# Patient Record
Sex: Female | Born: 1985 | Race: Asian | Hispanic: No | Marital: Married | Smoking: Never smoker
Health system: Southern US, Community
[De-identification: ages and names within clinical notes are randomized; demographics above are authoritative.]

## PROBLEM LIST (undated history)

## (undated) DIAGNOSIS — G5601 Carpal tunnel syndrome, right upper limb: Secondary | ICD-10-CM

## (undated) HISTORY — PX: THYROID CYST EXCISION: SHX2511

---

## 2006-08-27 ENCOUNTER — Inpatient Hospital Stay (HOSPITAL_COMMUNITY): Admission: AD | Admit: 2006-08-27 | Discharge: 2006-08-28 | Payer: Self-pay | Admitting: Obstetrics & Gynecology

## 2006-11-08 ENCOUNTER — Ambulatory Visit (HOSPITAL_COMMUNITY): Admission: RE | Admit: 2006-11-08 | Discharge: 2006-11-08 | Payer: Self-pay | Admitting: Family Medicine

## 2007-03-13 ENCOUNTER — Inpatient Hospital Stay (HOSPITAL_COMMUNITY): Admission: AD | Admit: 2007-03-13 | Discharge: 2007-03-13 | Payer: Self-pay | Admitting: Gynecology

## 2007-03-31 ENCOUNTER — Inpatient Hospital Stay (HOSPITAL_COMMUNITY): Admission: AD | Admit: 2007-03-31 | Discharge: 2007-03-31 | Payer: Self-pay | Admitting: Obstetrics and Gynecology

## 2007-04-01 ENCOUNTER — Ambulatory Visit: Payer: Self-pay | Admitting: Obstetrics and Gynecology

## 2007-04-01 ENCOUNTER — Inpatient Hospital Stay (HOSPITAL_COMMUNITY): Admission: AD | Admit: 2007-04-01 | Discharge: 2007-04-03 | Payer: Self-pay | Admitting: Obstetrics & Gynecology

## 2009-01-01 ENCOUNTER — Inpatient Hospital Stay (HOSPITAL_COMMUNITY): Admission: AD | Admit: 2009-01-01 | Discharge: 2009-01-02 | Payer: Self-pay | Admitting: Obstetrics and Gynecology

## 2009-01-09 ENCOUNTER — Inpatient Hospital Stay (HOSPITAL_COMMUNITY): Admission: AD | Admit: 2009-01-09 | Discharge: 2009-01-09 | Payer: Self-pay | Admitting: Obstetrics & Gynecology

## 2009-02-21 IMAGING — CT CT ANGIO CHEST
3 of 4 series · 19 of 30 positions shown · IV contrast (omnipaque)
Comparison: None.

CLINICAL DATA: 21 year old; 37 weeks pregnant with shortness of breath.
 CT ANGIOGRAPHY OF CHEST WITH CONTRAST ? 03/13/07:
TECHNIQUE: Multidetector CT imaging of the chest was performed during bolus injection of intravenous contrast.  Multiplanar CT angiographic image reconstructions were generated to evaluate the vascular anatomy. 
 Contrast: 150 cc Omnipaque 300 IV.

[Series 5: recon 3: pe chest · axial · 0.67mm/px · z∈[-244,-50]mm · 11 of 238 slices shown]
[im 22/238  lung]
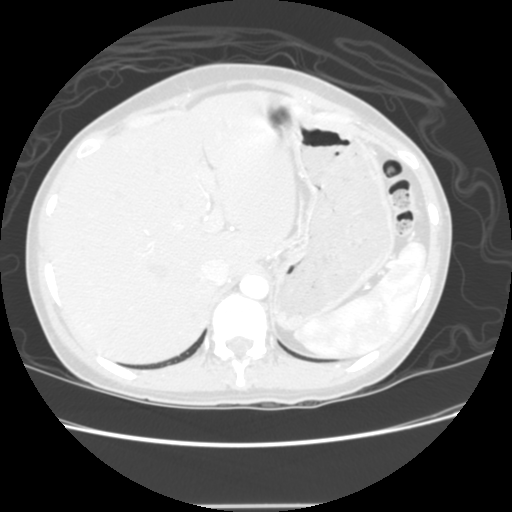
[im 44/238  mediastinal]
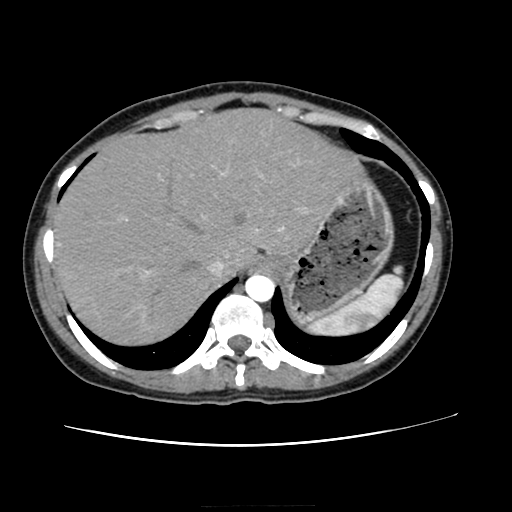
[im 65/238  lung]
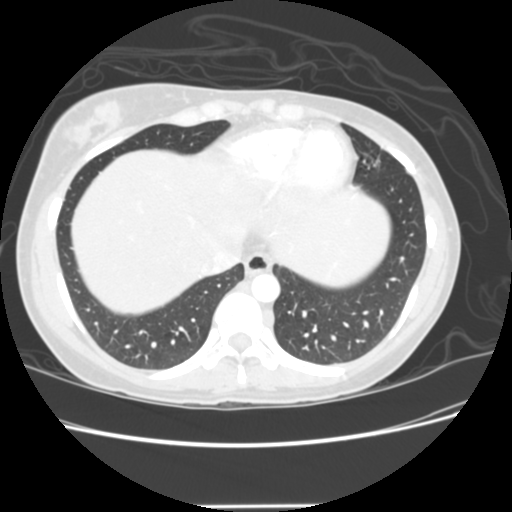
[im 87/238  mediastinal]
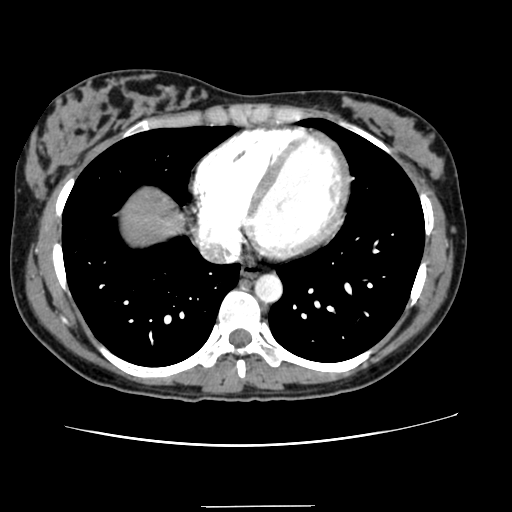
[im 108/238  lung]
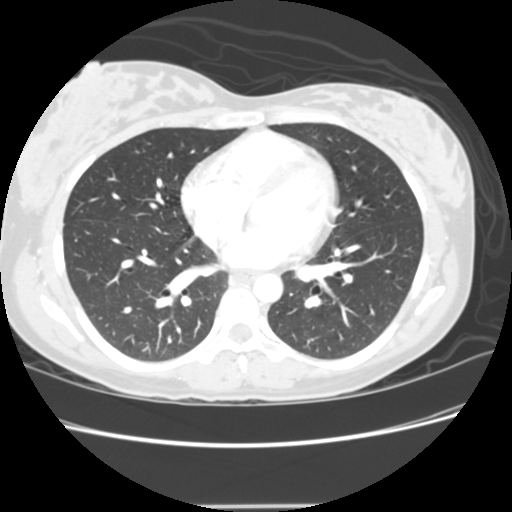
[im 130/238  mediastinal]
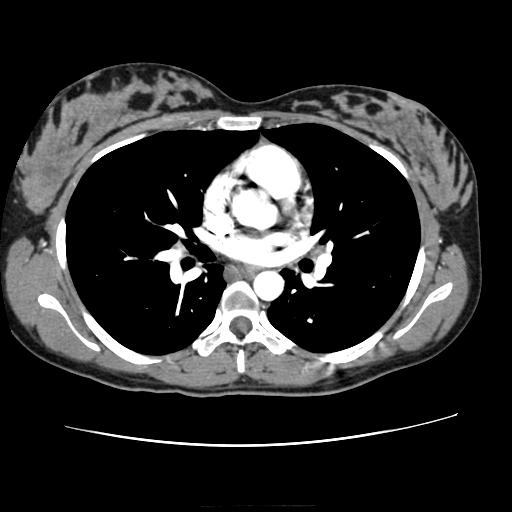
[im 151/238  lung]
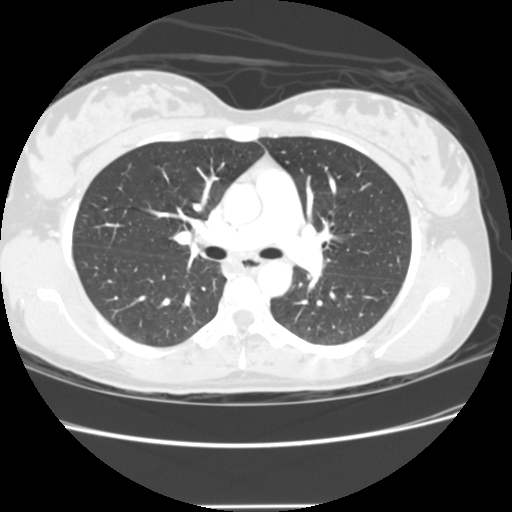
[im 152/238  mediastinal]
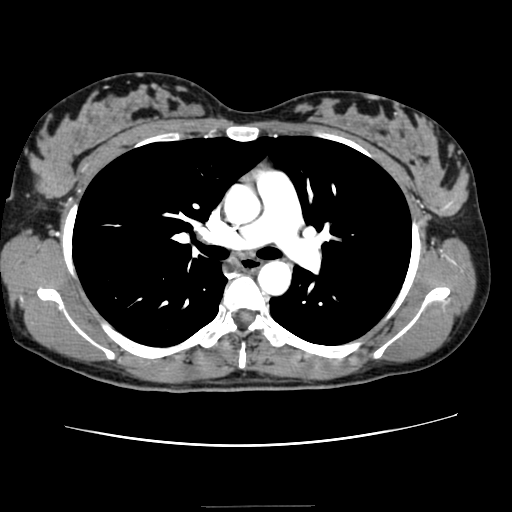
[im 173/238  lung]
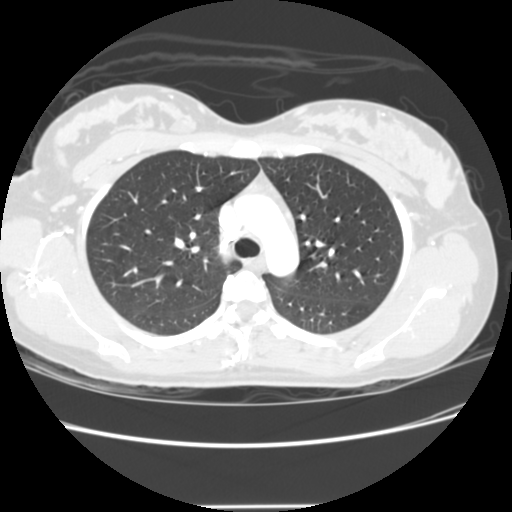
[im 194/238  mediastinal]
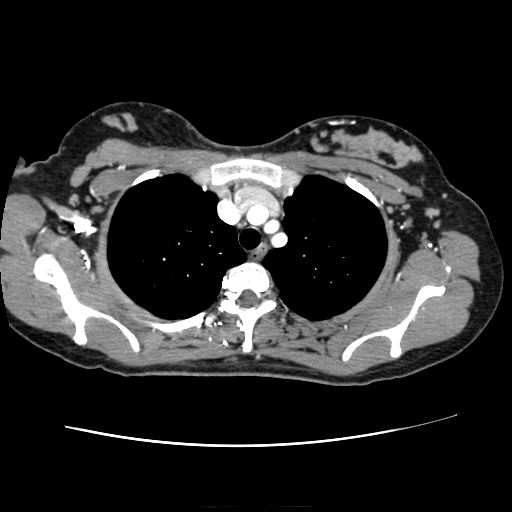
[im 216/238  lung]
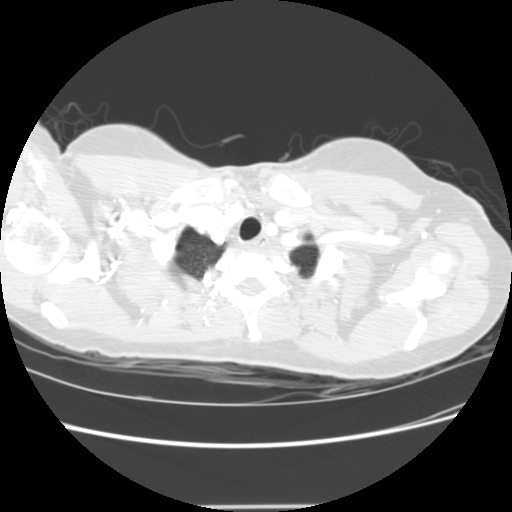

[Series 500: reformatted · coronal · 0.67mm/px · 3 of 106 slices shown (1 of 2)]
[im 27/106  lung]
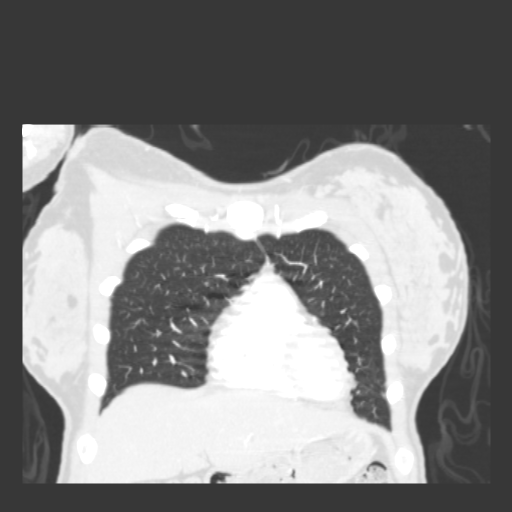
[im 53/106  lung]
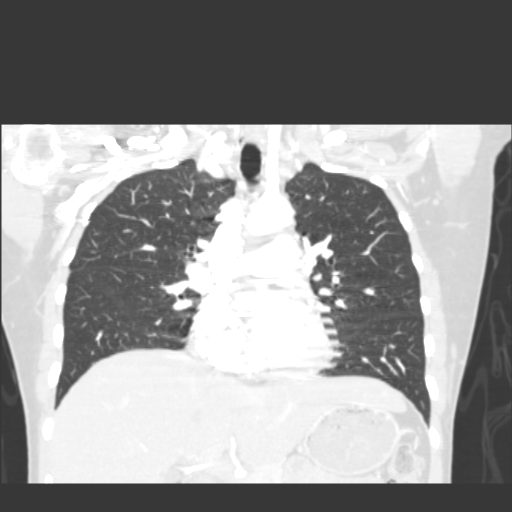
[im 79/106  lung]
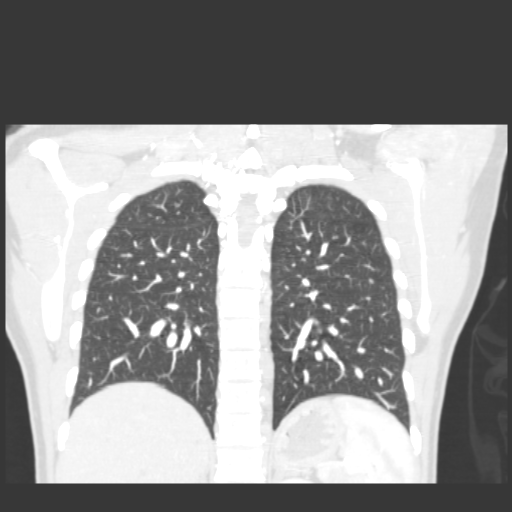

[Series 501: reformatted · sagittal · 0.67mm/px · 5 of 139 slices shown (2 of 2)]
[im 24/139  lung]
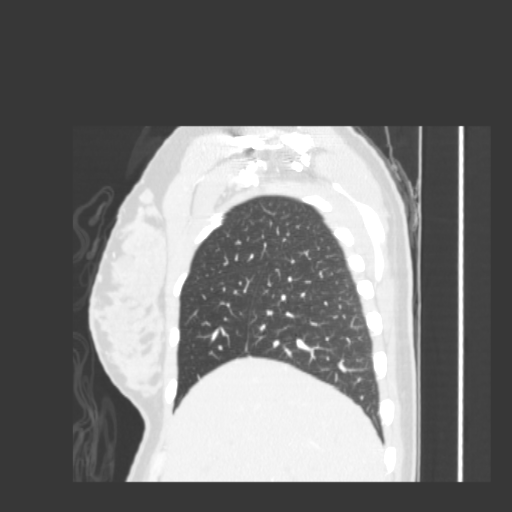
[im 47/139  lung]
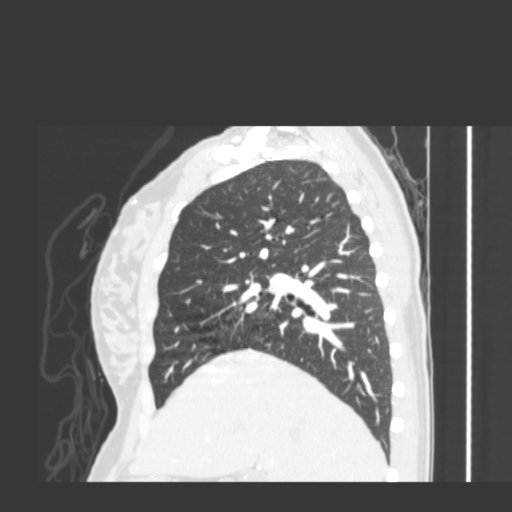
[im 70/139  lung]
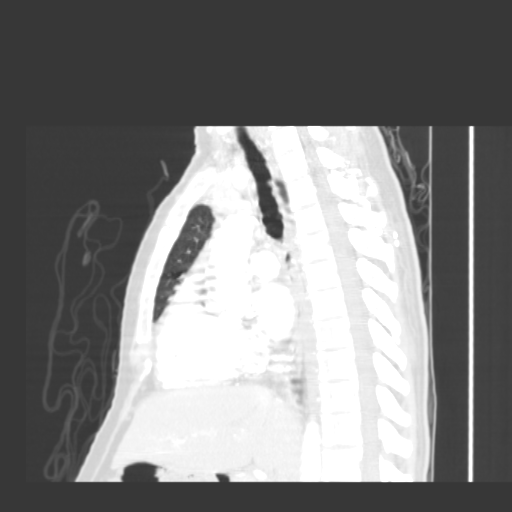
[im 93/139  lung]
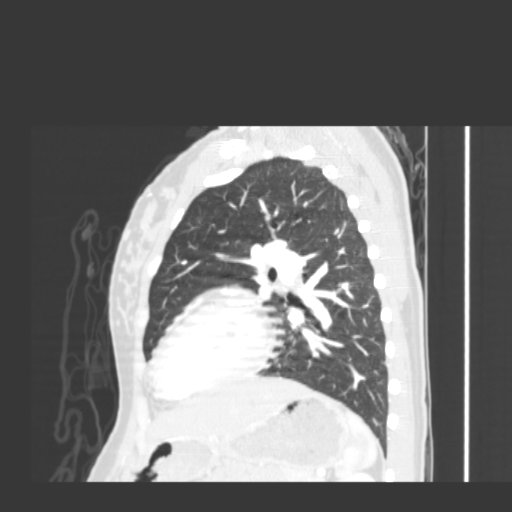
[im 116/139  lung]
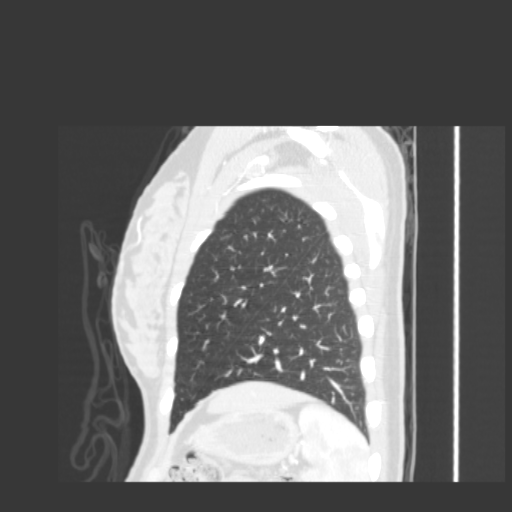

[19 of 30 positions shown; findings below may reference images not displayed]

FINDINGS: The chest wall, soft tissues, and bony structures are unremarkable.  No supraclavicular or axillary adenopathy.  
 The heart size is normal.   No pericardial effusion.   No mediastinal or hilar adenopathy.  The esophagus is grossly normal.  
 The aorta is normal in caliber.  No dissection.  Major branch vessels are normal.
 The pulmonary arterial tree is well opacified.  There are no filling defects to suggest pulmonary emboli.
 Examination of the lung parenchyma demonstrates no acute pulmonary findings.  No edema, infiltrates, or effusions. The tracheobronchial tree appears normal. 
 The visualized upper abdomen is unremarkable.
IMPRESSION: 1.  No CT evidence for pulmonary emboli.
 2.  No mediastinal or hilar adenopathy.
 3.  Normal thoracic aorta.
 4.  No acute pulmonary findings.

## 2009-04-29 ENCOUNTER — Ambulatory Visit (HOSPITAL_COMMUNITY): Admission: RE | Admit: 2009-04-29 | Discharge: 2009-04-29 | Payer: Self-pay | Admitting: Obstetrics & Gynecology

## 2009-08-16 ENCOUNTER — Inpatient Hospital Stay (HOSPITAL_COMMUNITY): Admission: AD | Admit: 2009-08-16 | Discharge: 2009-08-17 | Payer: Self-pay | Admitting: Family Medicine

## 2009-08-16 ENCOUNTER — Ambulatory Visit: Payer: Self-pay | Admitting: Family Medicine

## 2009-08-18 ENCOUNTER — Ambulatory Visit: Payer: Self-pay | Admitting: Obstetrics and Gynecology

## 2009-08-18 ENCOUNTER — Inpatient Hospital Stay (HOSPITAL_COMMUNITY): Admission: AD | Admit: 2009-08-18 | Discharge: 2009-08-19 | Payer: Self-pay | Admitting: Obstetrics and Gynecology

## 2010-07-15 LAB — CBC
HCT: 33.5 % — ABNORMAL LOW (ref 36.0–46.0)
Hemoglobin: 11.1 g/dL — ABNORMAL LOW (ref 12.0–15.0)
MCHC: 31.8 g/dL (ref 30.0–36.0)
MCV: 74.6 fL — ABNORMAL LOW (ref 78.0–100.0)
MCV: 75.8 fL — ABNORMAL LOW (ref 78.0–100.0)
Platelets: 179 10*3/uL (ref 150–400)
Platelets: 205 10*3/uL (ref 150–400)
RBC: 4.69 MIL/uL (ref 3.87–5.11)
RDW: 15.5 % (ref 11.5–15.5)

## 2010-07-15 LAB — RPR: RPR Ser Ql: NONREACTIVE

## 2010-08-01 LAB — CBC
HCT: 32.7 % — ABNORMAL LOW (ref 36.0–46.0)
Hemoglobin: 10.6 g/dL — ABNORMAL LOW (ref 12.0–15.0)
Hemoglobin: 9.9 g/dL — ABNORMAL LOW (ref 12.0–15.0)
MCHC: 32.3 g/dL (ref 30.0–36.0)
RBC: 4.43 MIL/uL (ref 3.87–5.11)
RDW: 16.4 % — ABNORMAL HIGH (ref 11.5–15.5)
WBC: 7.1 10*3/uL (ref 4.0–10.5)
WBC: 7.9 10*3/uL (ref 4.0–10.5)

## 2010-08-01 LAB — GC/CHLAMYDIA PROBE AMP, GENITAL: Chlamydia, DNA Probe: NEGATIVE

## 2010-08-01 LAB — POCT PREGNANCY, URINE: Preg Test, Ur: POSITIVE

## 2010-08-01 LAB — URINE MICROSCOPIC-ADD ON

## 2010-08-01 LAB — URINALYSIS, ROUTINE W REFLEX MICROSCOPIC
Bilirubin Urine: NEGATIVE
Ketones, ur: NEGATIVE mg/dL
Leukocytes, UA: NEGATIVE
Nitrite: NEGATIVE
Specific Gravity, Urine: <1.005 — ABNORMAL LOW (ref 1.005–1.030)
Urobilinogen, UA: 0.2 mg/dL (ref 0.0–1.0)

## 2010-08-01 LAB — ABO/RH: ABO/RH(D): A POS

## 2010-08-01 LAB — WET PREP, GENITAL
Trich, Wet Prep: NONE SEEN
Yeast Wet Prep HPF POC: NONE SEEN

## 2010-12-13 IMAGING — US US OB COMP LESS 14 WK
1 series · 14 of 28 positions shown · non-contrast
Comparison: none

CLINICAL DATA: Pain

OBSTETRIC <14 WK ULTRASOUND, TRANSVAGINAL OB US
TECHNIQUE: Transabdominal and transvaginal ultrasound was
performed for evaluation of the gestation as well as the maternal
uterus and adnexal regions.
Number of gestation: 1
Heart Rate: 106 bpm
CRL:  2.4 mm         5w  5d                US EDC:
Maternal uterus/adnexae:
There is no subchorionic hemorrhage.
The left ovary appears normal.
There is a simple appearing cyst within the right ovary measuring
2.7 x 2.4 x 2.0 cm.

[Series 1: us ob comp less 14 wks · 0.18mm/px · 14 of 40 slices shown]
[im 2/40]
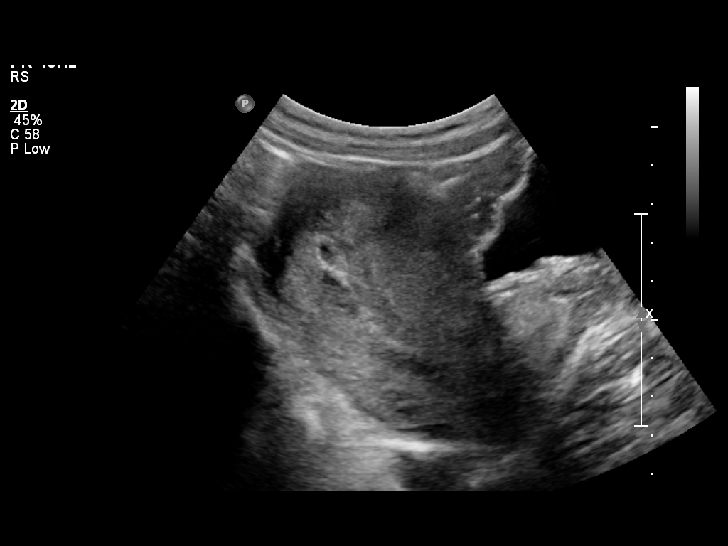
[im 5/40]
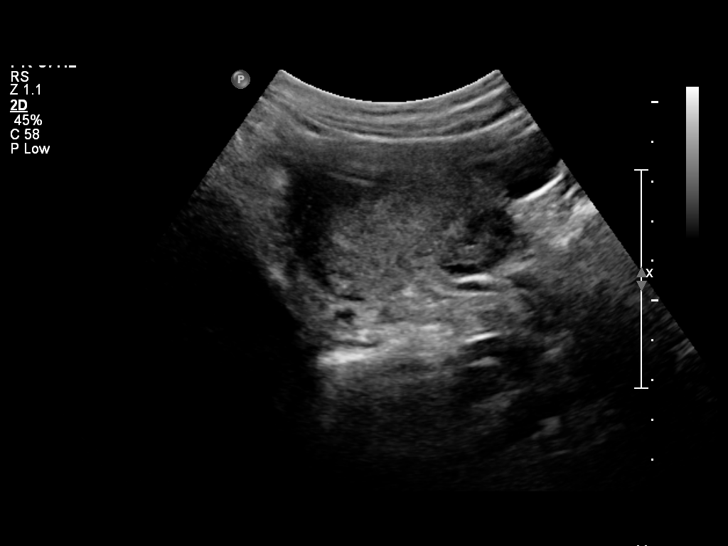
[im 8/40]
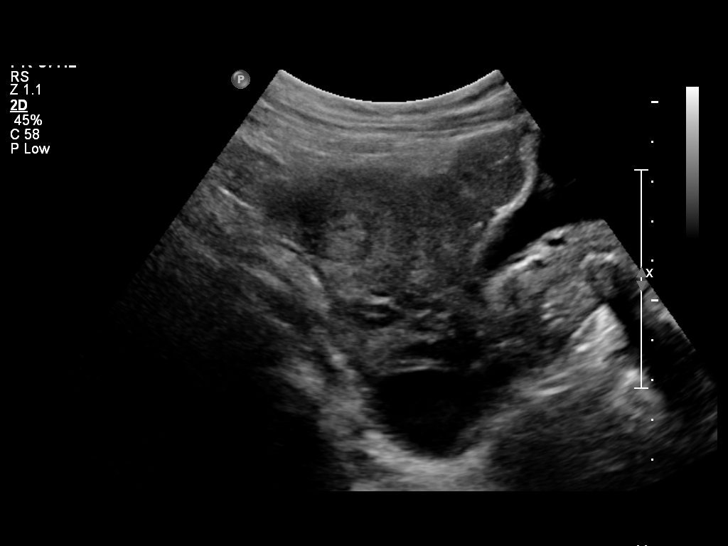
[im 11/40]
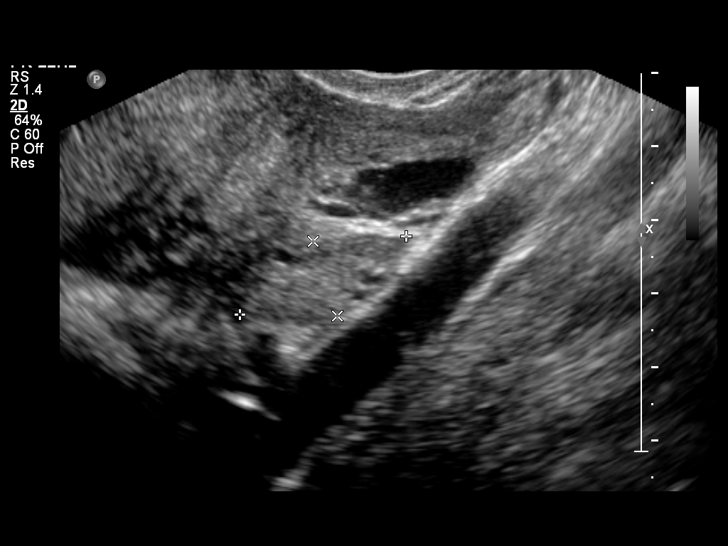
[im 14/40]
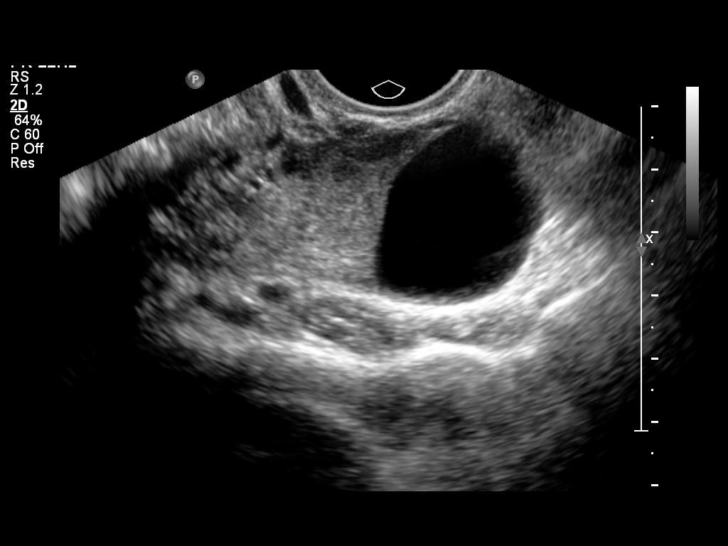
[im 16/40]
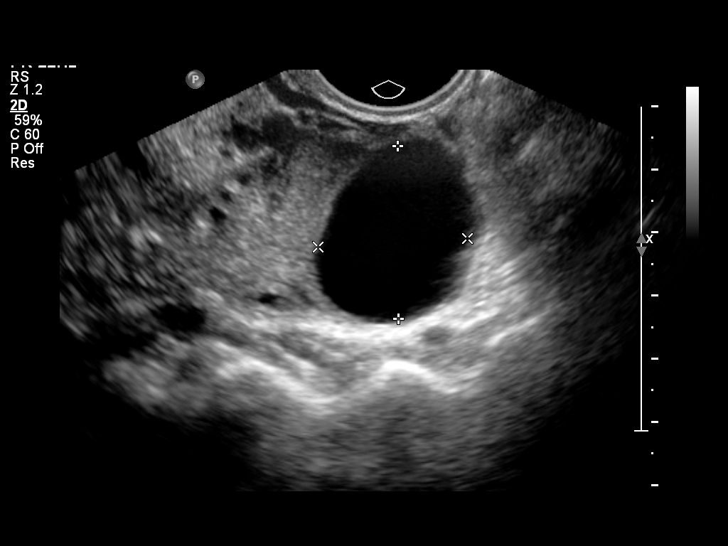
[im 19/40]
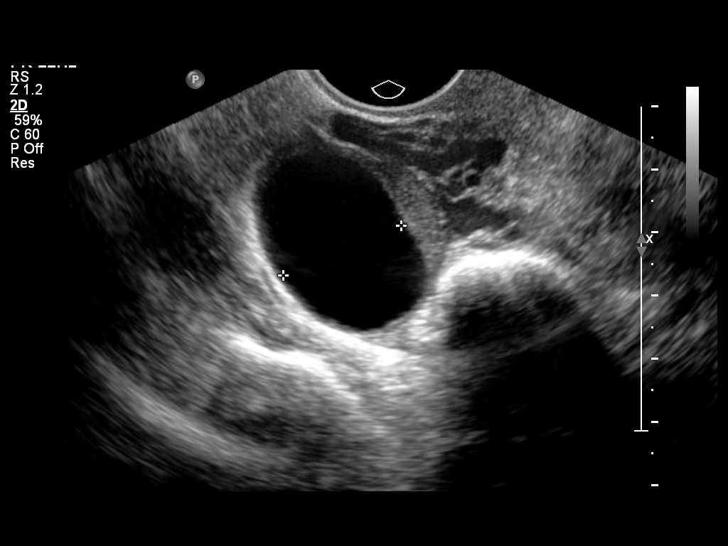
[im 22/40]
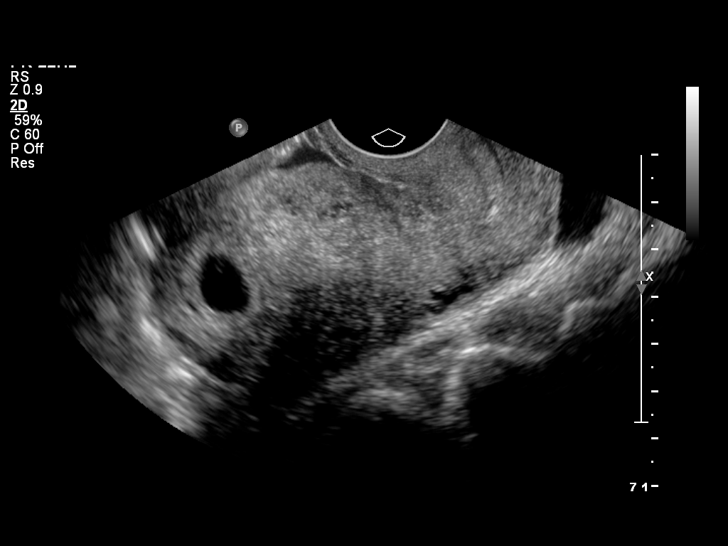
[im 25/40]
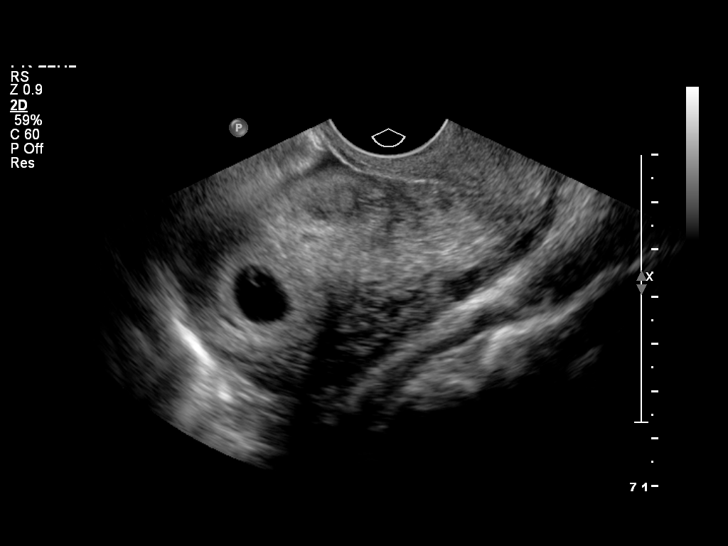
[im 28/40]
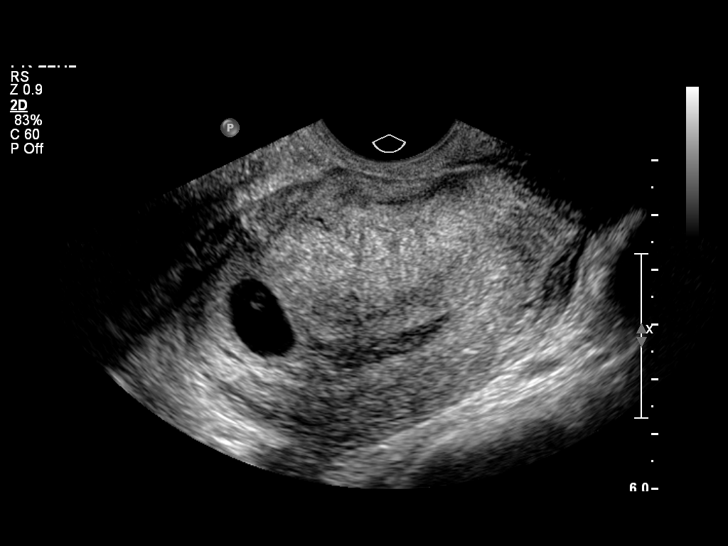
[im 31/40]
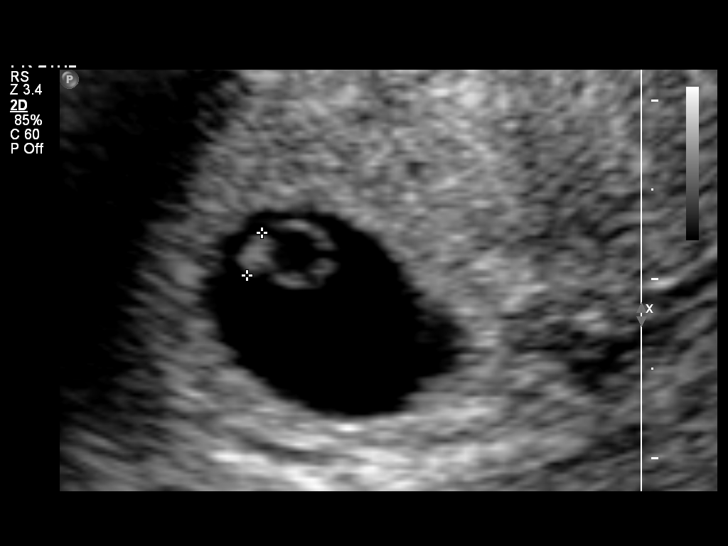
[im 34/40]
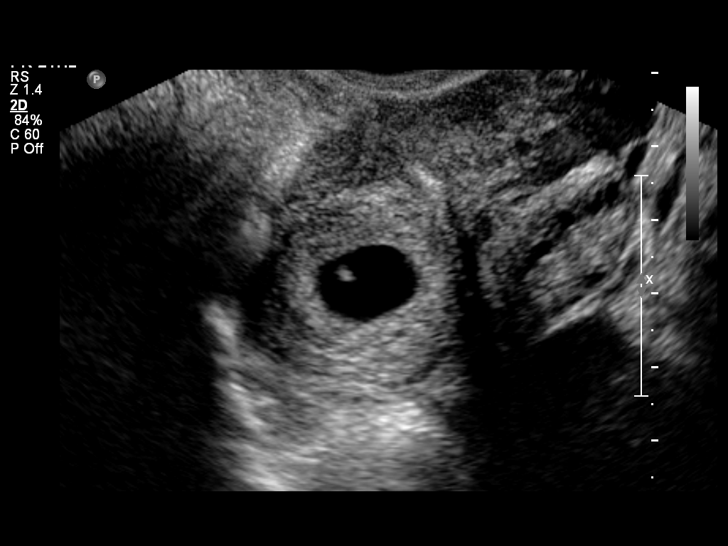
[im 37/40]
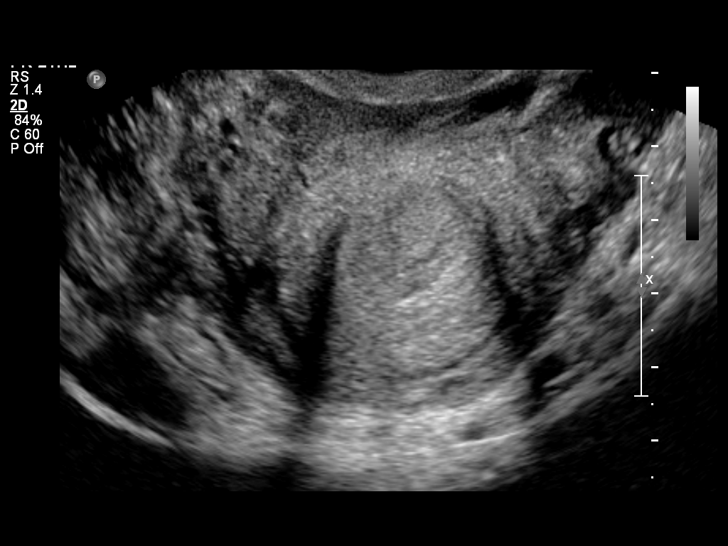
[im 40/40]
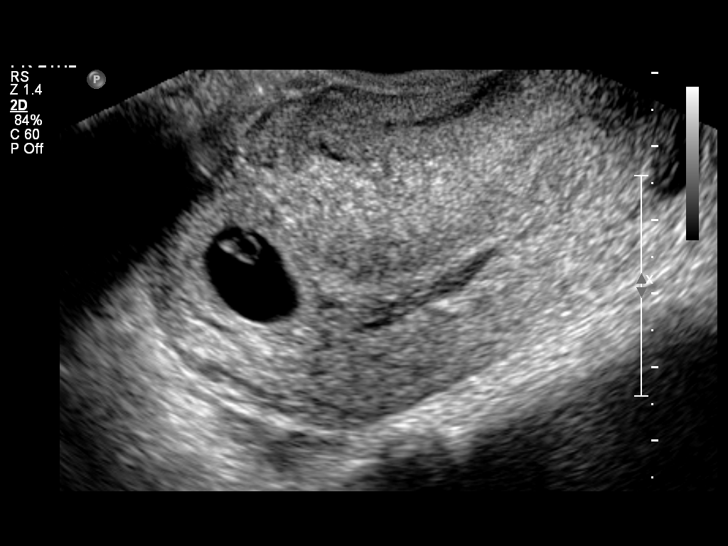

[14 of 28 positions shown; findings below may reference images not displayed]

IMPRESSION: 1.  Single intrauterine gestation with a crown-rump length of
mm within the estimated gestational age of 5 weeks and 5 days.
Correlate with the patient's estimated gestational age by last
menstrual period.
2.  Right ovarian cyst.

## 2010-12-17 ENCOUNTER — Ambulatory Visit (INDEPENDENT_AMBULATORY_CARE_PROVIDER_SITE_OTHER): Payer: No Typology Code available for payment source | Admitting: Obstetrics and Gynecology

## 2010-12-17 ENCOUNTER — Encounter: Payer: Self-pay | Admitting: Obstetrics and Gynecology

## 2010-12-17 VITALS — BP 113/90 | HR 74 | Ht 59.0 in | Wt 109.0 lb

## 2010-12-17 DIAGNOSIS — Z01419 Encounter for gynecological examination (general) (routine) without abnormal findings: Secondary | ICD-10-CM

## 2010-12-17 DIAGNOSIS — Z1272 Encounter for screening for malignant neoplasm of vagina: Secondary | ICD-10-CM

## 2010-12-17 NOTE — Progress Notes (Signed)
  Subjective:    Patient ID: Lori Munoz, female    DOB: 04/09/1986, 24 y.o.   MRN: [id number]  HPI 25 yo G2P2 presenting today for annual exam. Patient is without any complaints and denies abnormal bleeding, discharge or pelvic pain. Patient is in a monogamous relationship with husband, using ParaGard IUD for birth control (inserted in May 2011).  PMHx: Denies  PSHx:  Removal of thyroid nodule in 2007  PObHx: NSVD x2  PGYNHx: Denies cys, fibroids or h/o abnormal pap smears  Family Hx: father with HTN and gastric cancer; grandmother with lung cancer. No h/o gyn, or breast cancer  Social Hx: Denies drinking, smoking, or the use of illicit drugs. Lives with husband and 2 children  Review of Systems  All other systems reviewed and are negative.       Objective:   Physical Exam GENERAL: Well-developed, well-nourished female in no acute distress.  HEENT: Normocephalic, atraumatic. Sclerae anicteric.  NECK: Supple. Normal thyroid.  LUNGS: Clear to auscultation bilaterally.  HEART: Regular rate and rhythm. BREASTS: Symmetric in size. No palpable masses or lymphadenopathy, skin changes, or nipple drainage. ABDOMEN: Soft, nontender, nondistended. No organomegaly. PELVIC: Normal external female genitalia. Vagina is pink and rugated.  Normal discharge. Normal appearing cervix. IUD strings visualized at external os. Uterus is normal in size. No adnexal mass or tenderness. EXTREMITIES: No cyanosis, clubbing, or edema, 2+ distal pulses.        Assessment & Plan:  25 yo G2P2 here for annual exam - Pap smear performed - Patient encouraged to take daily multivitamins and exercise daily for 30 minutes. - Patient instructed and encouraged to perform monthly self- breast exam. - Patient will be contacted with any abnormal results

## 2010-12-17 NOTE — Patient Instructions (Signed)

## 2011-02-02 LAB — CBC
HCT: 35.7 — ABNORMAL LOW
Hemoglobin: 11.6 — ABNORMAL LOW
MCHC: 32.4
Platelets: 247

## 2011-02-02 LAB — RPR: RPR Ser Ql: NONREACTIVE

## 2011-02-03 LAB — COMPREHENSIVE METABOLIC PANEL
ALT: 15
AST: 20
Alkaline Phosphatase: 129 — ABNORMAL HIGH
BUN: 5 — ABNORMAL LOW
CO2: 24
Calcium: 8.8
Creatinine, Ser: 0.48
GFR calc Af Amer: >60
Glucose, Bld: 101 — ABNORMAL HIGH
Potassium: 3.8
Total Protein: 6.2

## 2011-02-03 LAB — CBC
HCT: 32.9 — ABNORMAL LOW
Hemoglobin: 10.8 — ABNORMAL LOW
MCHC: 33
MCV: 71.6 — ABNORMAL LOW
RBC: 4.59
WBC: 11 — ABNORMAL HIGH

## 2011-04-10 IMAGING — US US OB COMP +14 WK
1 series · 14 of 28 positions shown · non-contrast
Comparison: none

OBSTETRICAL ULTRASOUND:
 This ultrasound exam was performed in the [HOSPITAL] Ultrasound Department.  The OB US report was generated in the [facility] system, and faxed to the ordering physician.  This report is also available in [HOSPITAL]?s [employer] and in [[email or web address]] PACS.

[Series 1: us ob comp +14 wk · 46 acquisitions, 14 frames shown]
[im 2/46]
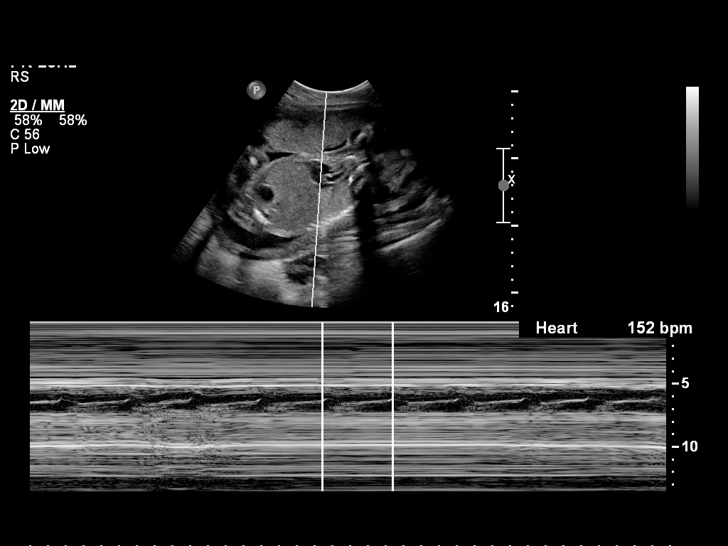
[im 6/46]
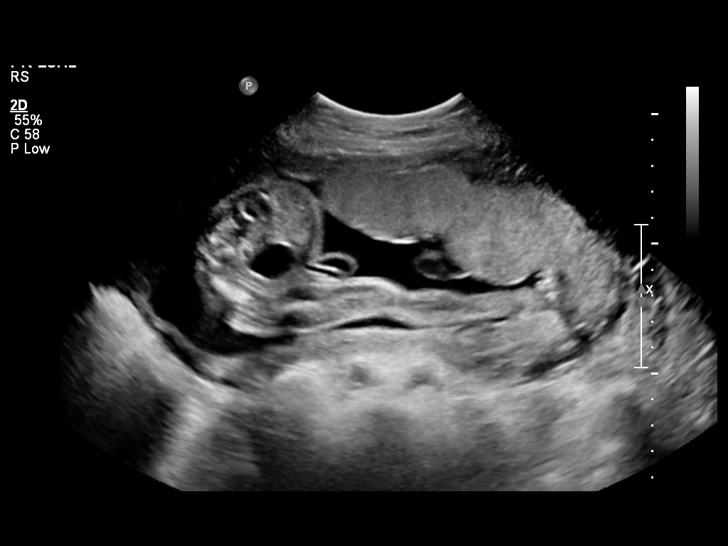
[im 9/46]
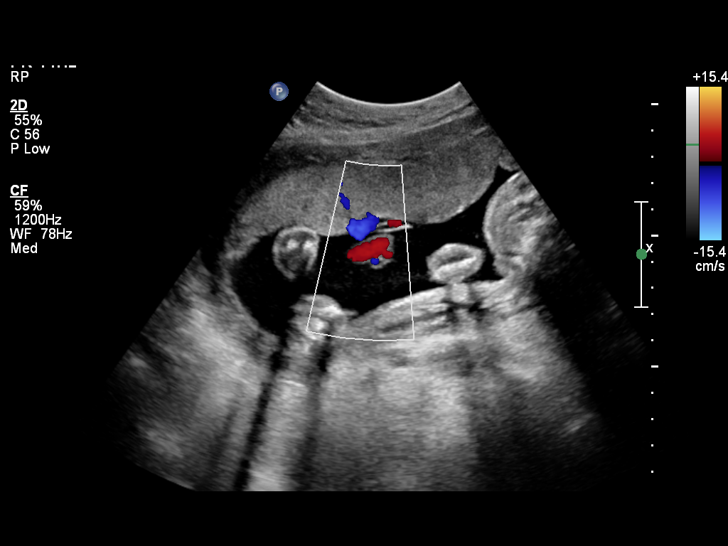
[im 12/46]
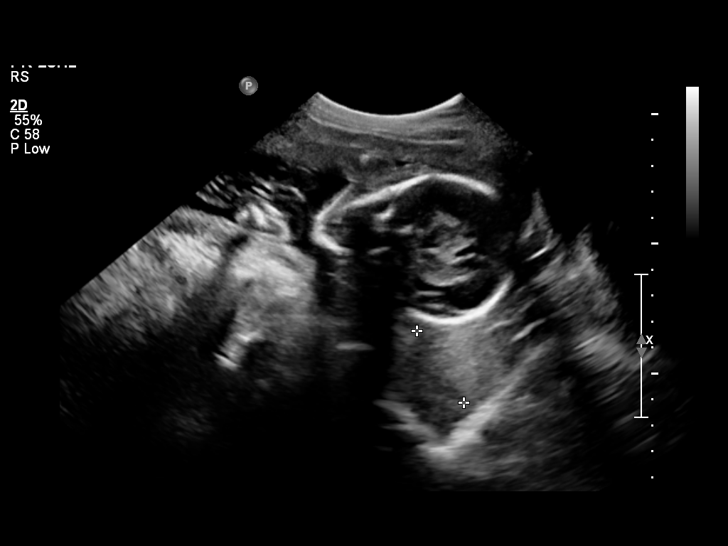
[im 16/46]
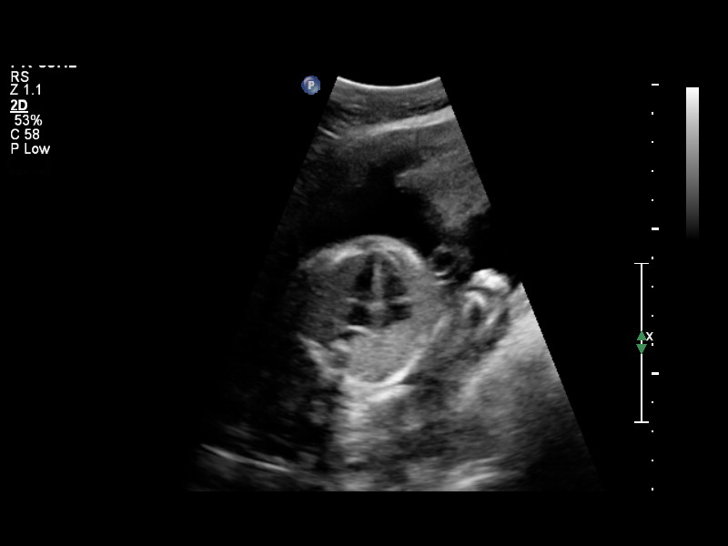
[im 19/46]
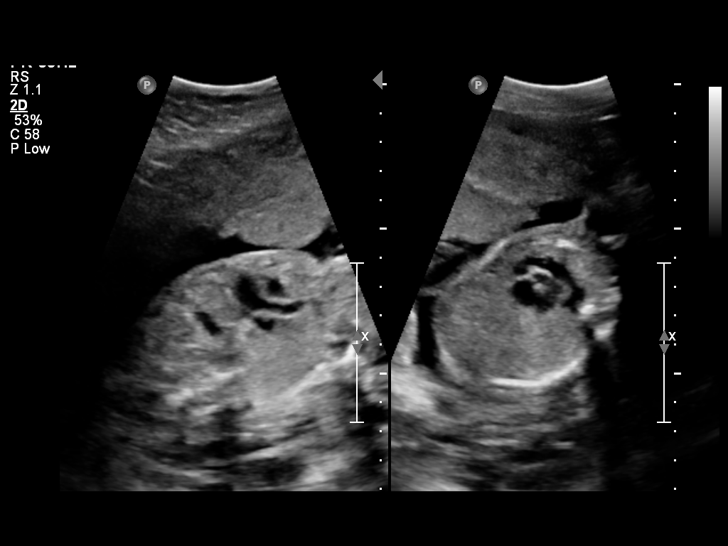
[im 22/46]
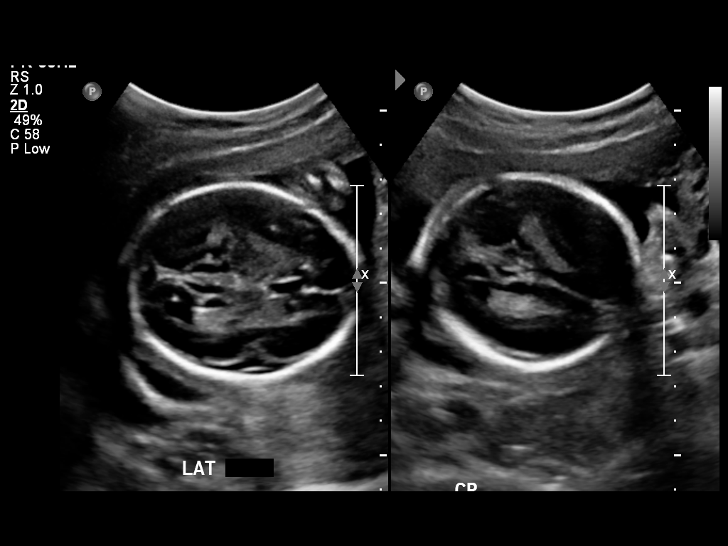
[im 26/46]
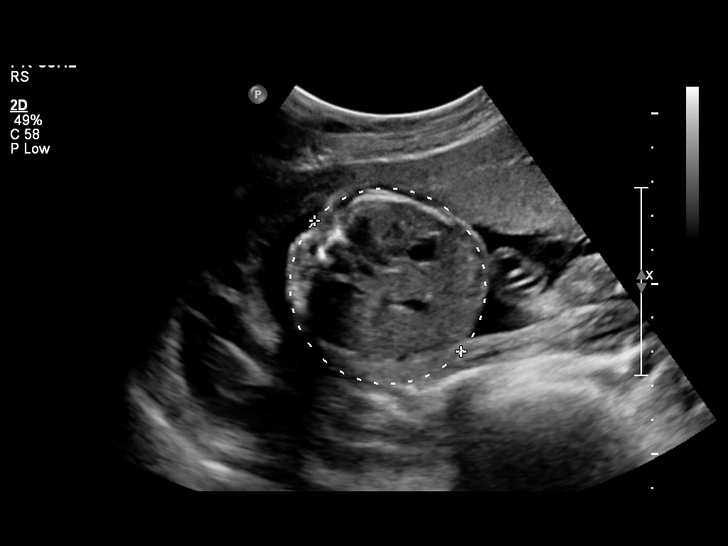
[im 29/46]
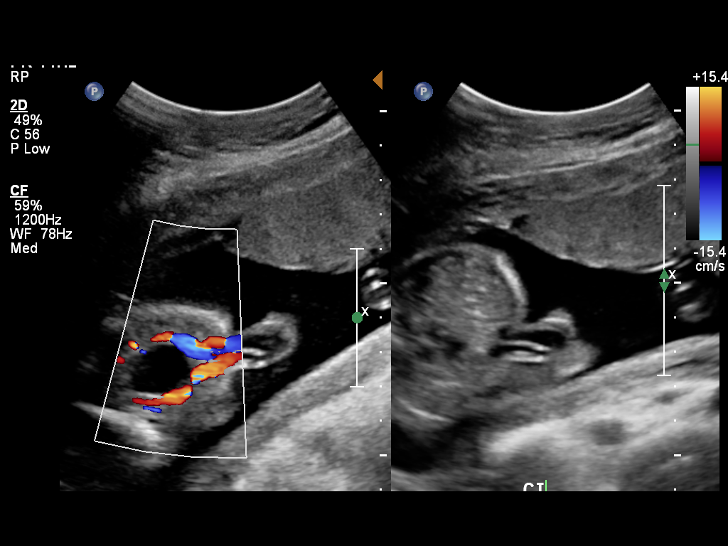
[im 32/46]
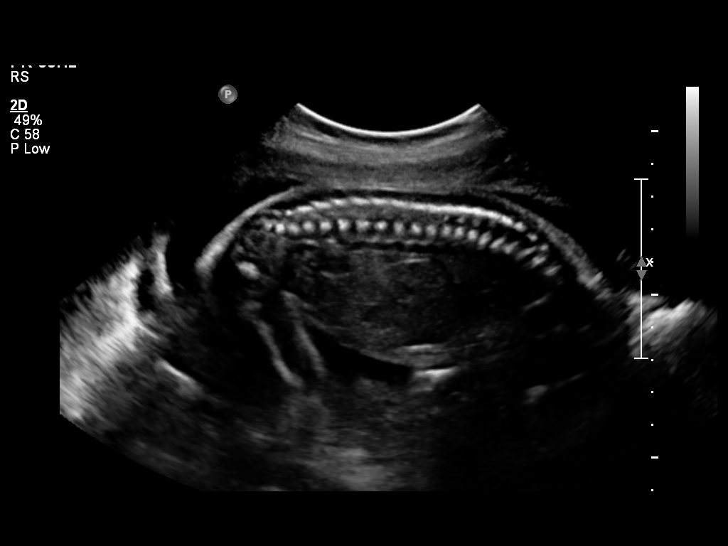
[im 36/46]
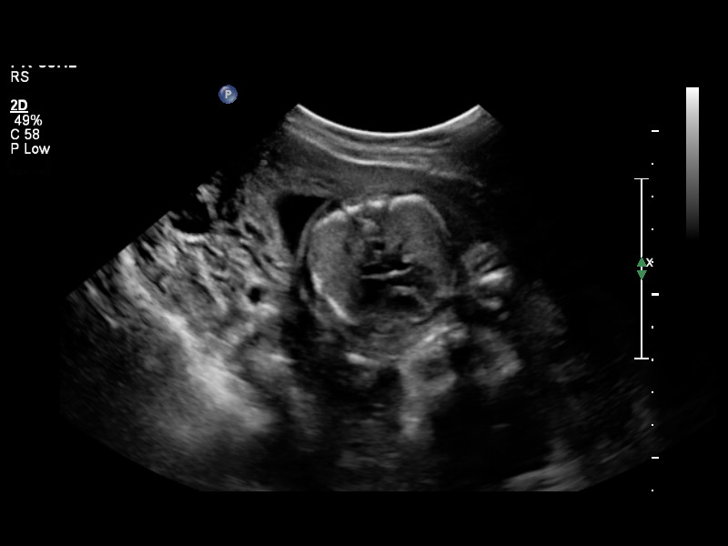
[im 39/46]
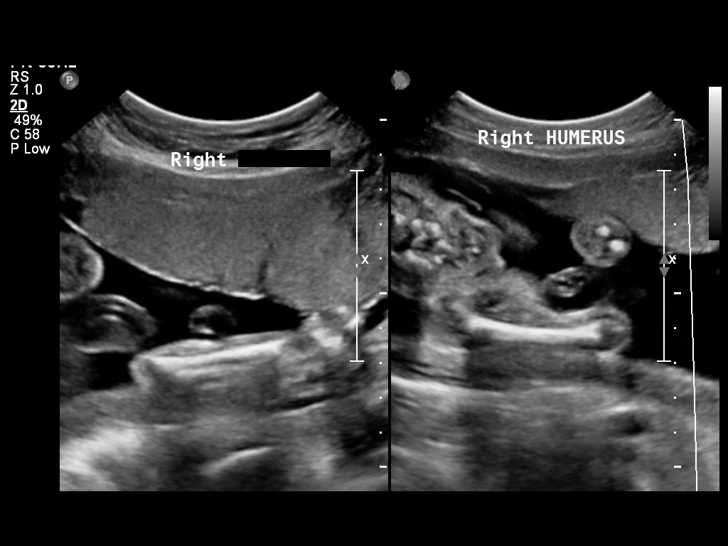
[im 42/46]
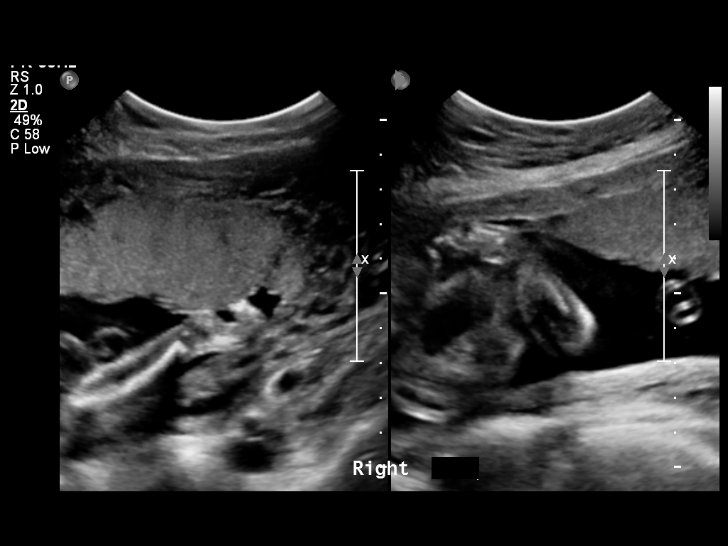
[im 46/46]
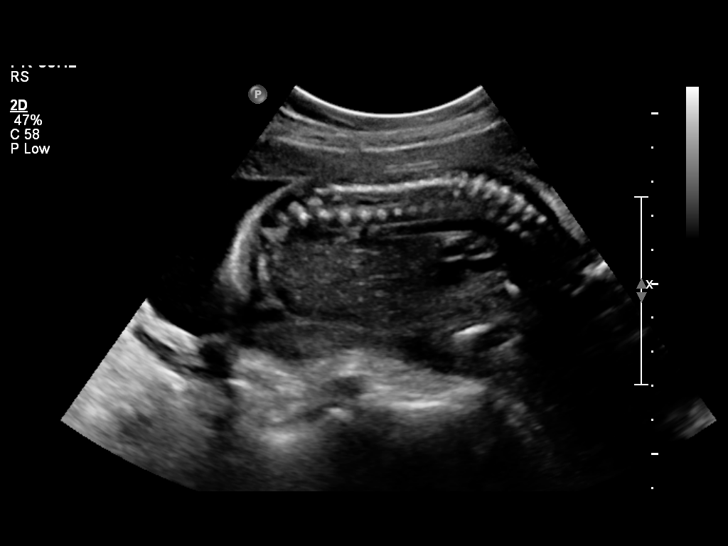

[14 of 28 positions shown; findings below may reference images not displayed]

IMPRESSION: See [facility] Obstetric US report.

## 2011-12-22 ENCOUNTER — Encounter: Payer: Self-pay | Admitting: Family Medicine

## 2011-12-22 ENCOUNTER — Ambulatory Visit (INDEPENDENT_AMBULATORY_CARE_PROVIDER_SITE_OTHER): Payer: No Typology Code available for payment source | Admitting: Family Medicine

## 2011-12-22 VITALS — BP 102/55 | HR 76 | Ht 59.0 in | Wt 111.0 lb

## 2011-12-22 DIAGNOSIS — Z01419 Encounter for gynecological examination (general) (routine) without abnormal findings: Secondary | ICD-10-CM

## 2011-12-22 DIAGNOSIS — Z124 Encounter for screening for malignant neoplasm of cervix: Secondary | ICD-10-CM

## 2011-12-22 LAB — LIPID PANEL: Cholesterol: 168 mg/dL (ref 0–200)

## 2011-12-22 LAB — CBC
HCT: 36.4 % (ref 36.0–46.0)
Hemoglobin: 11.3 g/dL — ABNORMAL LOW (ref 12.0–15.0)
MCH: 20.9 pg — ABNORMAL LOW (ref 26.0–34.0)
MCV: 67.4 fL — ABNORMAL LOW (ref 78.0–100.0)
Platelets: 216 10*3/uL (ref 150–400)
RBC: 5.4 MIL/uL — ABNORMAL HIGH (ref 3.87–5.11)
WBC: 6.7 10*3/uL (ref 4.0–10.5)

## 2011-12-22 LAB — COMPREHENSIVE METABOLIC PANEL
ALT: <8 U/L (ref 0–35)
CO2: 27 mEq/L (ref 19–32)
Calcium: 9.4 mg/dL (ref 8.4–10.5)
Chloride: 104 mEq/L (ref 96–112)
Creat: 0.61 mg/dL (ref 0.50–1.10)
Glucose, Bld: 64 mg/dL — ABNORMAL LOW (ref 70–99)

## 2011-12-22 NOTE — Progress Notes (Signed)
  Subjective:     Lori Munoz is a 26 y.o. female and is here for a comprehensive physical exam. The patient reports pargard in place.  Regular menses, somewhat heavier with IUD.  Complains of carpal tunnel pain.  Works in a [employer].   History   Social History  . Marital Status: Married    Spouse Name: N/A    Number of Children: N/A  . Years of Education: N/A   Occupational History  . Not on file.   Social History Main Topics  . Smoking status: Never Smoker   . Smokeless tobacco: Never Used  . Alcohol Use: No  . Drug Use: No  . Sexually Active: Yes -- Female partner(s)    Birth Control/ Protection: IUD   Other Topics Concern  . Not on file   Social History Narrative  . No narrative on file   Health Maintenance  Topic Date Due  . Tetanus/tdap  11/06/2002  . Influenza Vaccine  01/26/2012  . Pap Smear  12/16/2013    The following portions of the patient's history were reviewed and updated as appropriate: allergies, current medications, past family history, past medical history, past social history, past surgical history and problem list.  Review of Systems A comprehensive review of systems was negative.   Objective:    BP 102/55  Pulse 76  Ht 4\' 11"  (1.499 m)  Wt 111 lb (50.349 kg)  BMI 22.42 kg/m2  LMP 11/23/2011 General appearance: alert, cooperative and appears stated age Head: Normocephalic, without obvious abnormality, atraumatic Neck: no adenopathy, supple, symmetrical, trachea midline and thyroid not enlarged, symmetric, no tenderness/mass/nodules Back: symmetric, no curvature. ROM normal. No CVA tenderness. Lungs: clear to auscultation bilaterally Breasts: normal appearance, no masses or tenderness Heart: regular rate and rhythm, S1, S2 normal, no murmur, click, rub or gallop Abdomen: soft, non-tender; bowel sounds normal; no masses,  no organomegaly Pelvic: cervix normal in appearance, external genitalia normal, no adnexal masses or tenderness, no  cervical motion tenderness, uterus normal size, shape, and consistency, vagina normal without discharge and IUD strings visualized Extremities: extremities normal, atraumatic, no cyanosis or edema Pulses: 2+ and symmetric Skin: Skin color, texture, turgor normal. No rashes or lesions Lymph nodes: Cervical, supraclavicular, and axillary nodes normal. Neurologic: Grossly normal    Assessment:    Healthy female exam. Carpal Tunnel.     Plan:    Pap smear Yearly labs. See After Visit Summary for Counseling Recommendations

## 2011-12-22 NOTE — Patient Instructions (Signed)
Lumbosacral Strain Lumbosacral strain is one of the most common causes of back pain. There are many causes of back pain. Most are not serious conditions. CAUSES  Your backbone (spinal column) is made up of 24 main vertebral bodies, the sacrum, and the coccyx. These are held together by muscles and tough, fibrous tissue (ligaments). Nerve roots pass through the openings between the vertebrae. A sudden move or injury to the back may cause injury to, or pressure on, these nerves. This may result in localized back pain or pain movement (radiation) into the buttocks, down the leg, and into the foot. Sharp, shooting pain from the buttock down the back of the leg (sciatica) is frequently associated with a ruptured (herniated) disk. Pain may be caused by muscle spasm alone. Your caregiver can often find the cause of your pain by the details of your symptoms and an exam. In some cases, you may need tests (such as X-rays). Your caregiver will work with you to decide if any tests are needed based on your specific exam. HOME CARE INSTRUCTIONS   Avoid an underactive lifestyle. Active exercise, as directed by your caregiver, is your greatest weapon against back pain.   Avoid hard physical activities (tennis, racquetball, waterskiing) if you are not in proper physical condition for it. This may aggravate or create problems.   If you have a back problem, avoid sports requiring sudden body movements. Swimming and walking are generally safer activities.   Maintain good posture.   Avoid becoming overweight (obese).   Use bed rest for only the most extreme, sudden (acute) episode. Your caregiver will help you determine how much bed rest is necessary.   For acute conditions, you may put ice on the injured area.   Put ice in a plastic bag.   Place a towel between your skin and the bag.   Leave the ice on for 15 to 20 minutes at a time, every 2 hours, or as needed.   After you are improved and more active, it  may help to apply heat for 30 minutes before activities.  See your caregiver if you are having pain that lasts longer than expected. Your caregiver can advise appropriate exercises or therapy if needed. With conditioning, most back problems can be avoided. SEEK IMMEDIATE MEDICAL CARE IF:   You have numbness, tingling, weakness, or problems with the use of your arms or legs.   You experience severe back pain not relieved with medicines.   There is a change in bowel or bladder control.   You have increasing pain in any area of the body, including your belly (abdomen).   You notice shortness of breath, dizziness, or feel faint.   You feel sick to your stomach (nauseous), are throwing up (vomiting), or become sweaty.   You notice discoloration of your toes or legs, or your feet get very cold.   Your back pain is getting worse.   You have a fever.  MAKE SURE YOU:   Understand these instructions.   Will watch your condition.   Will get help right away if you are not doing well or get worse.  Document Released: 01/21/2005 Document Revised: 04/02/2011 Document Reviewed: 07/13/2008 [facility] Patient Information 2012 [place], [place].Carpal Tunnel Syndrome The carpal tunnel is a narrow hollow area in the wrist. It is formed by the wrist bones and ligaments. Nerves, blood vessels, and tendons (cord like structures which attach muscle to bone) on the palm side (the side of your hand in the direction  your fingers bend) of your hand pass through the carpal tunnel. Repeated wrist motion or certain diseases may cause swelling within the tunnel. (That is why these are called repetitive trauma (damage caused by over use) disorders. It is also a common problem in late pregnancy.) This swelling pinches the main nerve in the wrist (median nerve) and causes the painful condition called carpal tunnel syndrome. A feeling of "pins and needles" may be noticed in the fingers or hand; however, the entire arm may  ache from this condition. Carpal tunnel syndrome may clear up by itself. Cortisone injections may help. Sometimes, an operation may be needed to free the pinched nerve. An electromyogram (a type of test) may be needed to confirm this diagnosis (learning what is wrong). This is a test which measures nerve conduction. The nerve conduction is usually slowed in a carpal tunnel syndrome. HOME CARE INSTRUCTIONS   If your caregiver prescribed medication to help reduce swelling, take as directed.   If you were given a splint to keep your wrist from bending, use it as instructed. It is important to wear the splint at night. Use the splint for as long as you have pain or numbness in your hand, arm or wrist. This may take 1 to 2 months.   If you have pain at night, it may help to rub or shake your hand, or elevate your hand above the level of your heart (the center of your chest).   It is important to give your wrist a rest by stopping the activities that are causing the problem. If your symptoms (problems) are work-related, you may need to talk to your employer about changing to a job that does not require using your wrist.   Only take over-the-counter or prescription medicines for pain, discomfort, or fever as directed by your caregiver.   Following periods of extended use, particularly strenuous use, apply an ice pack wrapped in a towel to the anterior (palm) side of the affected wrist for 20 to 30 minutes. Repeat as needed three to four times per day. This will help reduce the swelling.   Follow all instructions for follow-up with your caregiver. This includes any orthopedic referrals, physical therapy, and rehabilitation. Any delay in obtaining necessary care could result in a delay or failure of your condition to heal.  SEEK IMMEDIATE MEDICAL CARE IF:   You are still having pain and numbness following a week of treatment.   You develop new, unexplained symptoms.   Your current symptoms are getting  worse and are not helped or controlled with medications.  MAKE SURE YOU:   Understand these instructions.   Will watch your condition.   Will get help right away if you are not doing well or get worse.  Document Released: 04/10/2000 Document Revised: 04/02/2011 Document Reviewed: 02/27/2011 [facility] Patient Information 2012 [place], [place].Preventive Care for Adults, Female A healthy lifestyle and preventive care can promote health and wellness. Preventive health guidelines for women include the following key practices.  A routine yearly physical is a good way to check with your caregiver about your health and preventive screening. It is a chance to share any concerns and updates on your health, and to receive a thorough exam.   Visit your dentist for a routine exam and preventive care every 6 months. Brush your teeth twice a day and floss once a day. Good oral hygiene prevents tooth decay and gum disease.   The frequency of eye exams is based on your age,  health, family medical history, use of contact lenses, and other factors. Follow your caregiver's recommendations for frequency of eye exams.   Eat a healthy diet. Foods like vegetables, fruits, whole grains, low-fat dairy products, and lean protein foods contain the nutrients you need without too many calories. Decrease your intake of foods high in solid fats, added sugars, and salt. Eat the right amount of calories for you.Get information about a proper diet from your caregiver, if necessary.   Regular physical exercise is one of the most important things you can do for your health. Most adults should get at least 150 minutes of moderate-intensity exercise (any activity that increases your heart rate and causes you to sweat) each week. In addition, most adults need muscle-strengthening exercises on 2 or more days a week.   Maintain a healthy weight. The body mass index (BMI) is a screening tool to identify possible weight problems. It  provides an estimate of body fat based on height and weight. Your caregiver can help determine your BMI, and can help you achieve or maintain a healthy weight.For adults 20 years and older:   A BMI below 18.5 is considered underweight.   A BMI of 18.5 to 24.9 is normal.   A BMI of 25 to 29.9 is considered overweight.   A BMI of 30 and above is considered obese.   Maintain normal blood lipids and cholesterol levels by exercising and minimizing your intake of saturated fat. Eat a balanced diet with plenty of fruit and vegetables. Blood tests for lipids and cholesterol should begin at age 76 and be repeated every 5 years. If your lipid or cholesterol levels are high, you are over 50, or you are at high risk for heart disease, you may need your cholesterol levels checked more frequently.Ongoing high lipid and cholesterol levels should be treated with medicines if diet and exercise are not effective.   If you smoke, find out from your caregiver how to quit. If you do not use tobacco, do not start.   If you are pregnant, do not drink alcohol. If you are breastfeeding, be very cautious about drinking alcohol. If you are not pregnant and choose to drink alcohol, do not exceed 1 drink per day. One drink is considered to be 12 ounces (355 mL) of beer, 5 ounces (148 mL) of wine, or 1.5 ounces (44 mL) of liquor.   Avoid use of street drugs. Do not share needles with anyone. Ask for help if you need support or instructions about stopping the use of drugs.   High blood pressure causes heart disease and increases the risk of stroke. Your blood pressure should be checked at least every 1 to 2 years. Ongoing high blood pressure should be treated with medicines if weight loss and exercise are not effective.   If you are 31 to 28 years old, ask your caregiver if you should take aspirin to prevent strokes.   Diabetes screening involves taking a blood sample to check your fasting blood sugar level. This should be  done once every 3 years, after age 72, if you are within normal weight and without risk factors for diabetes. Testing should be considered at a younger age or be carried out more frequently if you are overweight and have at least 1 risk factor for diabetes.   Breast cancer screening is essential preventive care for women. You should practice "breast self-awareness." This means understanding the normal appearance and feel of your breasts and may include breast self-examination.  Any changes detected, no matter how small, should be reported to a caregiver. Women in their 56s and 30s should have a clinical breast exam (CBE) by a caregiver as part of a regular health exam every 1 to 3 years. After age 34, women should have a CBE every year. Starting at age 79, women should consider having a mammography (breast X-ray test) every year. Women who have a family history of breast cancer should talk to their caregiver about genetic screening. Women at a high risk of breast cancer should talk to their caregivers about having magnetic resonance imaging (MRI) and a mammography every year.   The Pap test is a screening test for cervical cancer. A Pap test can show cell changes on the cervix that might become cervical cancer if left untreated. A Pap test is a procedure in which cells are obtained and examined from the lower end of the uterus (cervix).   Women should have a Pap test starting at age 37.   Between ages 13 and 37, Pap tests should be repeated every 2 years.   Beginning at age 8, you should have a Pap test every 3 years as long as the past 3 Pap tests have been normal.   Some women have medical problems that increase the chance of getting cervical cancer. Talk to your caregiver about these problems. It is especially important to talk to your caregiver if a new problem develops soon after your last Pap test. In these cases, your caregiver may recommend more frequent screening and Pap tests.   The above  recommendations are the same for women who have or have not gotten the vaccine for human papillomavirus (HPV).   If you had a hysterectomy for a problem that was not cancer or a condition that could lead to cancer, then you no longer need Pap tests. Even if you no longer need a Pap test, a regular exam is a good idea to make sure no other problems are starting.   If you are between ages 22 and 49, and you have had normal Pap tests going back 10 years, you no longer need Pap tests. Even if you no longer need a Pap test, a regular exam is a good idea to make sure no other problems are starting.   If you have had past treatment for cervical cancer or a condition that could lead to cancer, you need Pap tests and screening for cancer for at least 20 years after your treatment.   If Pap tests have been discontinued, risk factors (such as a new sexual partner) need to be reassessed to determine if screening should be resumed.   The HPV test is an additional test that may be used for cervical cancer screening. The HPV test looks for the virus that can cause the cell changes on the cervix. The cells collected during the Pap test can be tested for HPV. The HPV test could be used to screen women aged 7 years and older, and should be used in women of any age who have unclear Pap test results. After the age of 28, women should have HPV testing at the same frequency as a Pap test.   Colorectal cancer can be detected and often prevented. Most routine colorectal cancer screening begins at the age of 35 and continues through age 73. However, your caregiver may recommend screening at an earlier age if you have risk factors for colon cancer. On a yearly basis, your caregiver may provide home  test kits to check for hidden blood in the stool. Use of a small camera at the end of a tube, to directly examine the colon (sigmoidoscopy or colonoscopy), can detect the earliest forms of colorectal cancer. Talk to your caregiver  about this at age 38, when routine screening begins. Direct examination of the colon should be repeated every 5 to 10 years through age 90, unless early forms of pre-cancerous polyps or small growths are found.   Hepatitis C blood testing is recommended for all people born from [birth year] through 1965 and any individual with known risks for hepatitis C.   Practice safe sex. Use condoms and avoid high-risk sexual practices to reduce the spread of sexually transmitted infections (STIs). STIs include gonorrhea, chlamydia, syphilis, trichomonas, herpes, HPV, and human immunodeficiency virus (HIV). Herpes, HIV, and HPV are viral illnesses that have no cure. They can result in disability, cancer, and death. Sexually active women aged 19 and younger should be checked for chlamydia. Older women with new or multiple partners should also be tested for chlamydia. Testing for other STIs is recommended if you are sexually active and at increased risk.   Osteoporosis is a disease in which the bones lose minerals and strength with aging. This can result in serious bone fractures. The risk of osteoporosis can be identified using a bone density scan. Women ages 44 and over and women at risk for fractures or osteoporosis should discuss screening with their caregivers. Ask your caregiver whether you should take a calcium supplement or vitamin D to reduce the rate of osteoporosis.   Menopause can be associated with physical symptoms and risks. Hormone replacement therapy is available to decrease symptoms and risks. You should talk to your caregiver about whether hormone replacement therapy is right for you.   Use sunscreen with sun protection factor (SPF) of 30 or more. Apply sunscreen liberally and repeatedly throughout the day. You should seek shade when your shadow is shorter than you. Protect yourself by wearing long sleeves, pants, a wide-brimmed hat, and sunglasses year round, whenever you are outdoors.   Once a month, do  a whole body skin exam, using a mirror to look at the skin on your back. Notify your caregiver of new moles, moles that have irregular borders, moles that are larger than a pencil eraser, or moles that have changed in shape or color.   Stay current with required immunizations.   Influenza. You need a dose every fall (or winter). The composition of the flu vaccine changes each year, so being vaccinated once is not enough.   Pneumococcal polysaccharide. You need 1 to 2 doses if you smoke cigarettes or if you have certain chronic medical conditions. You need 1 dose at age 74 (or older) if you have never been vaccinated.   Tetanus, diphtheria, pertussis (Tdap, Td). Get 1 dose of Tdap vaccine if you are younger than age 6, are over 42 and have contact with an infant, are a Research scientist (physical sciences), are pregnant, or simply want to be protected from whooping cough. After that, you need a Td booster dose every 10 years. Consult your caregiver if you have not had at least 3 tetanus and diphtheria-containing shots sometime in your life or have a deep or dirty wound.   HPV. You need this vaccine if you are a woman age 18 or younger. The vaccine is given in 3 doses over 6 months.   Measles, mumps, rubella (MMR). You need at least 1 dose of MMR if you  were born in [birth year] or later. You may also need a second dose.   Meningococcal. If you are age 72 to 24 and a first-year college student living in a residence hall, or have one of several medical conditions, you need to get vaccinated against meningococcal disease. You may also need additional booster doses.   Zoster (shingles). If you are age 22 or older, you should get this vaccine.   Varicella (chickenpox). If you have never had chickenpox or you were vaccinated but received only 1 dose, talk to your caregiver to find out if you need this vaccine.   Hepatitis A. You need this vaccine if you have a specific risk factor for hepatitis A virus infection or you simply  wish to be protected from this disease. The vaccine is usually given as 2 doses, 6 to 18 months apart.   Hepatitis B. You need this vaccine if you have a specific risk factor for hepatitis B virus infection or you simply wish to be protected from this disease. The vaccine is given in 3 doses, usually over 6 months.  Preventive Services / Frequency Ages 2 to 33  Blood pressure check.** / Every 1 to 2 years.   Lipid and cholesterol check.** / Every 5 years beginning at age 70.   Clinical breast exam.** / Every 3 years for women in their 83s and 30s.   Pap test.** / Every 2 years from ages 73 through 60. Every 3 years starting at age 25 through age 110 or 83 with a history of 3 consecutive normal Pap tests.   HPV screening.** / Every 3 years from ages 57 through ages 46 to 59 with a history of 3 consecutive normal Pap tests.   Hepatitis C blood test.** / For any individual with known risks for hepatitis C.   Skin self-exam. / Monthly.   Influenza immunization.** / Every year.   Pneumococcal polysaccharide immunization.** / 1 to 2 doses if you smoke cigarettes or if you have certain chronic medical conditions.   Tetanus, diphtheria, pertussis (Tdap, Td) immunization. / A one-time dose of Tdap vaccine. After that, you need a Td booster dose every 10 years.   HPV immunization. / 3 doses over 6 months, if you are 61 and younger.   Measles, mumps, rubella (MMR) immunization. / You need at least 1 dose of MMR if you were born in 1957 or later. You may also need a second dose.   Meningococcal immunization. / 1 dose if you are age 47 to 22 and a first-year college student living in a residence hall, or have one of several medical conditions, you need to get vaccinated against meningococcal disease. You may also need additional booster doses.   Varicella immunization.** / Consult your caregiver.   Hepatitis A immunization.** / Consult your caregiver. 2 doses, 6 to 18 months apart.   Hepatitis  B immunization.** / Consult your caregiver. 3 doses usually over 6 months.  Ages 63 to 83  Blood pressure check.** / Every 1 to 2 years.   Lipid and cholesterol check.** / Every 5 years beginning at age 57.   Clinical breast exam.** / Every year after age 41.   Mammogram.** / Every year beginning at age 38 and continuing for as long as you are in good health. Consult with your caregiver.   Pap test.** / Every 3 years starting at age 52 through age 49 or 70 with a history of 3 consecutive normal Pap tests.   HPV screening.** /  Every 3 years from ages 67 through ages 46 to 80 with a history of 3 consecutive normal Pap tests.   Fecal occult blood test (FOBT) of stool. / Every year beginning at age 69 and continuing until age 65. You may not need to do this test if you get a colonoscopy every 10 years.   Flexible sigmoidoscopy or colonoscopy.** / Every 5 years for a flexible sigmoidoscopy or every 10 years for a colonoscopy beginning at age 41 and continuing until age 44.   Hepatitis C blood test.** / For all people born from [birth year] through 1967 and any individual with known risks for hepatitis C.   Skin self-exam. / Monthly.   Influenza immunization.** / Every year.   Pneumococcal polysaccharide immunization.** / 1 to 2 doses if you smoke cigarettes or if you have certain chronic medical conditions.   Tetanus, diphtheria, pertussis (Tdap, Td) immunization.** / A one-time dose of Tdap vaccine. After that, you need a Td booster dose every 10 years.   Measles, mumps, rubella (MMR) immunization. / You need at least 1 dose of MMR if you were born in 1959 or later. You may also need a second dose.   Varicella immunization.** / Consult your caregiver.   Meningococcal immunization.** / Consult your caregiver.   Hepatitis A immunization.** / Consult your caregiver. 2 doses, 6 to 18 months apart.   Hepatitis B immunization.** / Consult your caregiver. 3 doses, usually over 6 months.  Ages 33  and over  Blood pressure check.** / Every 1 to 2 years.   Lipid and cholesterol check.** / Every 5 years beginning at age 18.   Clinical breast exam.** / Every year after age 35.   Mammogram.** / Every year beginning at age 21 and continuing for as long as you are in good health. Consult with your caregiver.   Pap test.** / Every 3 years starting at age 41 through age 59 or 36 with a 3 consecutive normal Pap tests. Testing can be stopped between 65 and 70 with 3 consecutive normal Pap tests and no abnormal Pap or HPV tests in the past 10 years.   HPV screening.** / Every 3 years from ages 62 through ages 60 or 10 with a history of 3 consecutive normal Pap tests. Testing can be stopped between 65 and 70 with 3 consecutive normal Pap tests and no abnormal Pap or HPV tests in the past 10 years.   Fecal occult blood test (FOBT) of stool. / Every year beginning at age 53 and continuing until age 35. You may not need to do this test if you get a colonoscopy every 10 years.   Flexible sigmoidoscopy or colonoscopy.** / Every 5 years for a flexible sigmoidoscopy or every 10 years for a colonoscopy beginning at age 65 and continuing until age 70.   Hepatitis C blood test.** / For all people born from [birth year] through 1967 and any individual with known risks for hepatitis C.   Osteoporosis screening.** / A one-time screening for women ages 76 and over and women at risk for fractures or osteoporosis.   Skin self-exam. / Monthly.   Influenza immunization.** / Every year.   Pneumococcal polysaccharide immunization.** / 1 dose at age 22 (or older) if you have never been vaccinated.   Tetanus, diphtheria, pertussis (Tdap, Td) immunization. / A one-time dose of Tdap vaccine if you are over 65 and have contact with an infant, are a Research scientist (physical sciences), or simply want to be protected from whooping  cough. After that, you need a Td booster dose every 10 years.   Varicella immunization.** / Consult your caregiver.    Meningococcal immunization.** / Consult your caregiver.   Hepatitis A immunization.** / Consult your caregiver. 2 doses, 6 to 18 months apart.   Hepatitis B immunization.** / Check with your caregiver. 3 doses, usually over 6 months.  ** Family history and personal history of risk and conditions may change your caregiver's recommendations. Document Released: 06/09/2001 Document Revised: 04/02/2011 Document Reviewed: 09/08/2010 [facility] Patient Information 2012 [place], [place].

## 2012-12-27 ENCOUNTER — Ambulatory Visit (INDEPENDENT_AMBULATORY_CARE_PROVIDER_SITE_OTHER): Payer: No Typology Code available for payment source | Admitting: Family Medicine

## 2012-12-27 ENCOUNTER — Encounter: Payer: Self-pay | Admitting: Family Medicine

## 2012-12-27 VITALS — BP 104/58 | HR 72 | Ht 59.0 in | Wt 109.0 lb

## 2012-12-27 DIAGNOSIS — Z01419 Encounter for gynecological examination (general) (routine) without abnormal findings: Secondary | ICD-10-CM

## 2012-12-27 NOTE — Progress Notes (Signed)
  Subjective:     Lori Munoz is a 27 y.o. female and is here for a comprehensive physical exam. The patient reports no problems. Had nml pap in 2013 and nml labs.  Continues with IUD, no issues.  Married, no new sexual partners.  Works in [employer].  History   Social History  . Marital Status: Married    Spouse Name: N/A    Number of Children: N/A  . Years of Education: N/A   Occupational History  . Not on file.   Social History Main Topics  . Smoking status: Never Smoker   . Smokeless tobacco: Never Used  . Alcohol Use: No  . Drug Use: No  . Sexual Activity: Yes    Partners: Male    Birth Control/ Protection: IUD   Other Topics Concern  . Not on file   Social History Narrative  . No narrative on file   Health Maintenance  Topic Date Due  . Tetanus/tdap  11/06/2002  . Influenza Vaccine  11/25/2012  . Pap Smear  12/22/2014    The following portions of the patient's history were reviewed and updated as appropriate: allergies, current medications, past family history, past medical history, past social history, past surgical history and problem list.  Review of Systems A comprehensive review of systems was negative except for: Musculoskeletal: positive for pain in ribs about 6 wks ago after a fall lasted 2 wks, now resolved.   Objective:    BP 104/58  Pulse 72  Ht 4\' 11"  (1.499 m)  Wt 109 lb (49.442 kg)  BMI 22 kg/m2  LMP 12/19/2012 General appearance: alert, cooperative and appears stated age Head: Normocephalic, without obvious abnormality, atraumatic Neck: no adenopathy, supple, symmetrical, trachea midline and thyroid not enlarged, symmetric, no tenderness/mass/nodules Lungs: clear to auscultation bilaterally Breasts: normal appearance, no masses or tenderness Heart: regular rate and rhythm, S1, S2 normal, no murmur, click, rub or gallop Abdomen: soft, non-tender; bowel sounds normal; no masses,  no organomegaly Pelvic: cervix normal in appearance,  external genitalia normal, no adnexal masses or tenderness, no cervical motion tenderness, uterus normal size, shape, and consistency, vagina normal without discharge and white IUD strings noted. Extremities: extremities normal, atraumatic, no cyanosis or edema Pulses: 2+ and symmetric Skin: Skin color, texture, turgor normal. No rashes or lesions Lymph nodes: Cervical, supraclavicular, and axillary nodes normal. Neurologic: Grossly normal    Assessment:    Healthy female exam.        Plan:    No need for pap since last nml, per new guidelines. Does not desire STD testing. No need to repeat normal labs from last year.  Continue Copper IUD. See After Visit Summary for Counseling Recommendations

## 2012-12-27 NOTE — Patient Instructions (Signed)
Preventive Care for Adults, Female A healthy lifestyle and preventive care can promote health and wellness. Preventive health guidelines for women include the following key practices.  A routine yearly physical is a good way to check with your caregiver about your health and preventive screening. It is a chance to share any concerns and updates on your health, and to receive a thorough exam.  Visit your dentist for a routine exam and preventive care every 6 months. Brush your teeth twice a day and floss once a day. Good oral hygiene prevents tooth decay and gum disease.  The frequency of eye exams is based on your age, health, family medical history, use of contact lenses, and other factors. Follow your caregiver's recommendations for frequency of eye exams.  Eat a healthy diet. Foods like vegetables, fruits, whole grains, low-fat dairy products, and lean protein foods contain the nutrients you need without too many calories. Decrease your intake of foods high in solid fats, added sugars, and salt. Eat the right amount of calories for you.Get information about a proper diet from your caregiver, if necessary.  Regular physical exercise is one of the most important things you can do for your health. Most adults should get at least 150 minutes of moderate-intensity exercise (any activity that increases your heart rate and causes you to sweat) each week. In addition, most adults need muscle-strengthening exercises on 2 or more days a week.  Maintain a healthy weight. The body mass index (BMI) is a screening tool to identify possible weight problems. It provides an estimate of body fat based on height and weight. Your caregiver can help determine your BMI, and can help you achieve or maintain a healthy weight.For adults 20 years and older:  A BMI below 18.5 is considered underweight.  A BMI of 18.5 to 24.9 is normal.  A BMI of 25 to 29.9 is considered overweight.  A BMI of 30 and above is  considered obese.  Maintain normal blood lipids and cholesterol levels by exercising and minimizing your intake of saturated fat. Eat a balanced diet with plenty of fruit and vegetables. Blood tests for lipids and cholesterol should begin at age 20 and be repeated every 5 years. If your lipid or cholesterol levels are high, you are over 50, or you are at high risk for heart disease, you may need your cholesterol levels checked more frequently.Ongoing high lipid and cholesterol levels should be treated with medicines if diet and exercise are not effective.  If you smoke, find out from your caregiver how to quit. If you do not use tobacco, do not start.  If you are pregnant, do not drink alcohol. If you are breastfeeding, be very cautious about drinking alcohol. If you are not pregnant and choose to drink alcohol, do not exceed 1 drink per day. One drink is considered to be 12 ounces (355 mL) of beer, 5 ounces (148 mL) of wine, or 1.5 ounces (44 mL) of liquor.  Avoid use of street drugs. Do not share needles with anyone. Ask for help if you need support or instructions about stopping the use of drugs.  High blood pressure causes heart disease and increases the risk of stroke. Your blood pressure should be checked at least every 1 to 2 years. Ongoing high blood pressure should be treated with medicines if weight loss and exercise are not effective.  If you are 55 to 29 years old, ask your caregiver if you should take aspirin to prevent strokes.  Diabetes   screening involves taking a blood sample to check your fasting blood sugar level. This should be done once every 3 years, after age 45, if you are within normal weight and without risk factors for diabetes. Testing should be considered at a younger age or be carried out more frequently if you are overweight and have at least 1 risk factor for diabetes.  Breast cancer screening is essential preventive care for women. You should practice "breast  self-awareness." This means understanding the normal appearance and feel of your breasts and may include breast self-examination. Any changes detected, no matter how small, should be reported to a caregiver. Women in their 20s and 30s should have a clinical breast exam (CBE) by a caregiver as part of a regular health exam every 1 to 3 years. After age 40, women should have a CBE every year. Starting at age 40, women should consider having a mammography (breast X-ray test) every year. Women who have a family history of breast cancer should talk to their caregiver about genetic screening. Women at a high risk of breast cancer should talk to their caregivers about having magnetic resonance imaging (MRI) and a mammography every year.  The Pap test is a screening test for cervical cancer. A Pap test can show cell changes on the cervix that might become cervical cancer if left untreated. A Pap test is a procedure in which cells are obtained and examined from the lower end of the uterus (cervix).  Women should have a Pap test starting at age 21.  Between ages 21 and 29, Pap tests should be repeated every 2 years.  Beginning at age 30, you should have a Pap test every 3 years as long as the past 3 Pap tests have been normal.  Some women have medical problems that increase the chance of getting cervical cancer. Talk to your caregiver about these problems. It is especially important to talk to your caregiver if a new problem develops soon after your last Pap test. In these cases, your caregiver may recommend more frequent screening and Pap tests.  The above recommendations are the same for women who have or have not gotten the vaccine for human papillomavirus (HPV).  If you had a hysterectomy for a problem that was not cancer or a condition that could lead to cancer, then you no longer need Pap tests. Even if you no longer need a Pap test, a regular exam is a good idea to make sure no other problems are  starting.  If you are between ages 65 and 70, and you have had normal Pap tests going back 10 years, you no longer need Pap tests. Even if you no longer need a Pap test, a regular exam is a good idea to make sure no other problems are starting.  If you have had past treatment for cervical cancer or a condition that could lead to cancer, you need Pap tests and screening for cancer for at least 20 years after your treatment.  If Pap tests have been discontinued, risk factors (such as a new sexual partner) need to be reassessed to determine if screening should be resumed.  The HPV test is an additional test that may be used for cervical cancer screening. The HPV test looks for the virus that can cause the cell changes on the cervix. The cells collected during the Pap test can be tested for HPV. The HPV test could be used to screen women aged 30 years and older, and should   be used in women of any age who have unclear Pap test results. After the age of 30, women should have HPV testing at the same frequency as a Pap test.  Colorectal cancer can be detected and often prevented. Most routine colorectal cancer screening begins at the age of 50 and continues through age 75. However, your caregiver may recommend screening at an earlier age if you have risk factors for colon cancer. On a yearly basis, your caregiver may provide home test kits to check for hidden blood in the stool. Use of a small camera at the end of a tube, to directly examine the colon (sigmoidoscopy or colonoscopy), can detect the earliest forms of colorectal cancer. Talk to your caregiver about this at age 50, when routine screening begins. Direct examination of the colon should be repeated every 5 to 10 years through age 75, unless early forms of pre-cancerous polyps or small growths are found.  Hepatitis C blood testing is recommended for all people born from 1945 through 1965 and any individual with known risks for hepatitis C.  Practice  safe sex. Use condoms and avoid high-risk sexual practices to reduce the spread of sexually transmitted infections (STIs). STIs include gonorrhea, chlamydia, syphilis, trichomonas, herpes, HPV, and human immunodeficiency virus (HIV). Herpes, HIV, and HPV are viral illnesses that have no cure. They can result in disability, cancer, and death. Sexually active women aged 25 and younger should be checked for chlamydia. Older women with new or multiple partners should also be tested for chlamydia. Testing for other STIs is recommended if you are sexually active and at increased risk.  Osteoporosis is a disease in which the bones lose minerals and strength with aging. This can result in serious bone fractures. The risk of osteoporosis can be identified using a bone density scan. Women ages 65 and over and women at risk for fractures or osteoporosis should discuss screening with their caregivers. Ask your caregiver whether you should take a calcium supplement or vitamin D to reduce the rate of osteoporosis.  Menopause can be associated with physical symptoms and risks. Hormone replacement therapy is available to decrease symptoms and risks. You should talk to your caregiver about whether hormone replacement therapy is right for you.  Use sunscreen with sun protection factor (SPF) of 30 or more. Apply sunscreen liberally and repeatedly throughout the day. You should seek shade when your shadow is shorter than you. Protect yourself by wearing long sleeves, pants, a wide-brimmed hat, and sunglasses year round, whenever you are outdoors.  Once a month, do a whole body skin exam, using a mirror to look at the skin on your back. Notify your caregiver of new moles, moles that have irregular borders, moles that are larger than a pencil eraser, or moles that have changed in shape or color.  Stay current with required immunizations.  Influenza. You need a dose every fall (or winter). The composition of the flu vaccine  changes each year, so being vaccinated once is not enough.  Pneumococcal polysaccharide. You need 1 to 2 doses if you smoke cigarettes or if you have certain chronic medical conditions. You need 1 dose at age 65 (or older) if you have never been vaccinated.  Tetanus, diphtheria, pertussis (Tdap, Td). Get 1 dose of Tdap vaccine if you are younger than age 65, are over 65 and have contact with an infant, are a healthcare worker, are pregnant, or simply want to be protected from whooping cough. After that, you need a Td   booster dose every 10 years. Consult your caregiver if you have not had at least 3 tetanus and diphtheria-containing shots sometime in your life or have a deep or dirty wound.  HPV. You need this vaccine if you are a woman age 26 or younger. The vaccine is given in 3 doses over 6 months.  Measles, mumps, rubella (MMR). You need at least 1 dose of MMR if you were born in 1957 or later. You may also need a second dose.  Meningococcal. If you are age 19 to 21 and a first-year college student living in a residence hall, or have one of several medical conditions, you need to get vaccinated against meningococcal disease. You may also need additional booster doses.  Zoster (shingles). If you are age 60 or older, you should get this vaccine.  Varicella (chickenpox). If you have never had chickenpox or you were vaccinated but received only 1 dose, talk to your caregiver to find out if you need this vaccine.  Hepatitis A. You need this vaccine if you have a specific risk factor for hepatitis A virus infection or you simply wish to be protected from this disease. The vaccine is usually given as 2 doses, 6 to 18 months apart.  Hepatitis B. You need this vaccine if you have a specific risk factor for hepatitis B virus infection or you simply wish to be protected from this disease. The vaccine is given in 3 doses, usually over 6 months. Preventive Services / Frequency Ages 19 to 39  Blood  pressure check.** / Every 1 to 2 years.  Lipid and cholesterol check.** / Every 5 years beginning at age 20.  Clinical breast exam.** / Every 3 years for women in their 20s and 30s.  Pap test.** / Every 2 years from ages 21 through 29. Every 3 years starting at age 30 through age 65 or 70 with a history of 3 consecutive normal Pap tests.  HPV screening.** / Every 3 years from ages 30 through ages 65 to 70 with a history of 3 consecutive normal Pap tests.  Hepatitis C blood test.** / For any individual with known risks for hepatitis C.  Skin self-exam. / Monthly.  Influenza immunization.** / Every year.  Pneumococcal polysaccharide immunization.** / 1 to 2 doses if you smoke cigarettes or if you have certain chronic medical conditions.  Tetanus, diphtheria, pertussis (Tdap, Td) immunization. / A one-time dose of Tdap vaccine. After that, you need a Td booster dose every 10 years.  HPV immunization. / 3 doses over 6 months, if you are 26 and younger.  Measles, mumps, rubella (MMR) immunization. / You need at least 1 dose of MMR if you were born in 1957 or later. You may also need a second dose.  Meningococcal immunization. / 1 dose if you are age 19 to 21 and a first-year college student living in a residence hall, or have one of several medical conditions, you need to get vaccinated against meningococcal disease. You may also need additional booster doses.  Varicella immunization.** / Consult your caregiver.  Hepatitis A immunization.** / Consult your caregiver. 2 doses, 6 to 18 months apart.  Hepatitis B immunization.** / Consult your caregiver. 3 doses usually over 6 months. Ages 40 to 64  Blood pressure check.** / Every 1 to 2 years.  Lipid and cholesterol check.** / Every 5 years beginning at age 20.  Clinical breast exam.** / Every year after age 40.  Mammogram.** / Every year beginning at age 40   and continuing for as long as you are in good health. Consult with your  caregiver.  Pap test.** / Every 3 years starting at age 30 through age 65 or 70 with a history of 3 consecutive normal Pap tests.  HPV screening.** / Every 3 years from ages 30 through ages 65 to 70 with a history of 3 consecutive normal Pap tests.  Fecal occult blood test (FOBT) of stool. / Every year beginning at age 50 and continuing until age 75. You may not need to do this test if you get a colonoscopy every 10 years.  Flexible sigmoidoscopy or colonoscopy.** / Every 5 years for a flexible sigmoidoscopy or every 10 years for a colonoscopy beginning at age 50 and continuing until age 75.  Hepatitis C blood test.** / For all people born from 1945 through 1965 and any individual with known risks for hepatitis C.  Skin self-exam. / Monthly.  Influenza immunization.** / Every year.  Pneumococcal polysaccharide immunization.** / 1 to 2 doses if you smoke cigarettes or if you have certain chronic medical conditions.  Tetanus, diphtheria, pertussis (Tdap, Td) immunization.** / A one-time dose of Tdap vaccine. After that, you need a Td booster dose every 10 years.  Measles, mumps, rubella (MMR) immunization. / You need at least 1 dose of MMR if you were born in 1957 or later. You may also need a second dose.  Varicella immunization.** / Consult your caregiver.  Meningococcal immunization.** / Consult your caregiver.  Hepatitis A immunization.** / Consult your caregiver. 2 doses, 6 to 18 months apart.  Hepatitis B immunization.** / Consult your caregiver. 3 doses, usually over 6 months. Ages 65 and over  Blood pressure check.** / Every 1 to 2 years.  Lipid and cholesterol check.** / Every 5 years beginning at age 20.  Clinical breast exam.** / Every year after age 40.  Mammogram.** / Every year beginning at age 40 and continuing for as long as you are in good health. Consult with your caregiver.  Pap test.** / Every 3 years starting at age 30 through age 65 or 70 with a 3  consecutive normal Pap tests. Testing can be stopped between 65 and 70 with 3 consecutive normal Pap tests and no abnormal Pap or HPV tests in the past 10 years.  HPV screening.** / Every 3 years from ages 30 through ages 65 or 70 with a history of 3 consecutive normal Pap tests. Testing can be stopped between 65 and 70 with 3 consecutive normal Pap tests and no abnormal Pap or HPV tests in the past 10 years.  Fecal occult blood test (FOBT) of stool. / Every year beginning at age 50 and continuing until age 75. You may not need to do this test if you get a colonoscopy every 10 years.  Flexible sigmoidoscopy or colonoscopy.** / Every 5 years for a flexible sigmoidoscopy or every 10 years for a colonoscopy beginning at age 50 and continuing until age 75.  Hepatitis C blood test.** / For all people born from 1945 through 1965 and any individual with known risks for hepatitis C.  Osteoporosis screening.** / A one-time screening for women ages 65 and over and women at risk for fractures or osteoporosis.  Skin self-exam. / Monthly.  Influenza immunization.** / Every year.  Pneumococcal polysaccharide immunization.** / 1 dose at age 65 (or older) if you have never been vaccinated.  Tetanus, diphtheria, pertussis (Tdap, Td) immunization. / A one-time dose of Tdap vaccine if you are over   65 and have contact with an infant, are a healthcare worker, or simply want to be protected from whooping cough. After that, you need a Td booster dose every 10 years.  Varicella immunization.** / Consult your caregiver.  Meningococcal immunization.** / Consult your caregiver.  Hepatitis A immunization.** / Consult your caregiver. 2 doses, 6 to 18 months apart.  Hepatitis B immunization.** / Check with your caregiver. 3 doses, usually over 6 months. ** Family history and personal history of risk and conditions may change your caregiver's recommendations. Document Released: 06/09/2001 Document Revised: 07/06/2011  Document Reviewed: 09/08/2010 [employer] Patient Information 2014 [employer].  

## 2013-03-02 ENCOUNTER — Ambulatory Visit (INDEPENDENT_AMBULATORY_CARE_PROVIDER_SITE_OTHER): Payer: No Typology Code available for payment source | Admitting: Obstetrics & Gynecology

## 2013-03-02 DIAGNOSIS — T8339XA Other mechanical complication of intrauterine contraceptive device, initial encounter: Secondary | ICD-10-CM

## 2013-03-02 DIAGNOSIS — N921 Excessive and frequent menstruation with irregular cycle: Secondary | ICD-10-CM

## 2013-03-02 DIAGNOSIS — Z30431 Encounter for routine checking of intrauterine contraceptive device: Secondary | ICD-10-CM

## 2013-03-07 ENCOUNTER — Encounter: Payer: Self-pay | Admitting: Obstetrics & Gynecology

## 2013-03-07 NOTE — Progress Notes (Signed)
GYNECOLOGY CLINIC ENCOUNTER NOTE  History:  27 y.o. G2P2 here today for evaluation of heavy bleeding with Paragard IUD in place. Reports that she had the Paragard for about 3 years now, has regular monthly periods.  However, she had an episode of heavy bleeding this week and thought she palpated IUD strings at her introitus.  She just wants to ensure the IUD is in place.   The following portions of the patient's history were reviewed and updated as appropriate: allergies, current medications, past family history, past medical history, past social history, past surgical history and problem list.  Review of Systems:  Pertinent items are noted in HPI.  Objective:  Physical Exam There were no vitals taken for this visit. Gen: NAD Abd: Soft, nontender and nondistended Pelvic: Normal appearing external genitalia; normal appearing vaginal mucosa and cervix.  Small amount of blood in vagina, Paragard strings visualized.  Small uterus, no other palpable masses, no uterine or adnexal tenderness  Labs and Imaging Bedside TV ultrasound showed IUD to be in place in uterus..  Assessment & Plan:  Bleeding with Paragard in place; heavier than normal period but no signs/symptoms of anemia Patient reassured about IUD being in place Bleeding precautions reviewed  Jaynie Collins, MD, FACOG Attending Obstetrician & Gynecologist Faculty Practice, [facility] [place]

## 2013-03-07 NOTE — Patient Instructions (Signed)
Return to clinic for any scheduled appointments or for any gynecologic concerns as needed.   

## 2013-05-17 ENCOUNTER — Ambulatory Visit: Payer: No Typology Code available for payment source | Admitting: Family Medicine

## 2013-10-09 ENCOUNTER — Ambulatory Visit (INDEPENDENT_AMBULATORY_CARE_PROVIDER_SITE_OTHER): Payer: BC Managed Care – PPO | Admitting: Emergency Medicine

## 2013-10-09 VITALS — BP 100/70 | HR 77 | Temp 98.1°F | Resp 16 | Ht 59.75 in | Wt 114.0 lb

## 2013-10-09 DIAGNOSIS — L255 Unspecified contact dermatitis due to plants, except food: Secondary | ICD-10-CM

## 2013-10-09 MED ORDER — TRIAMCINOLONE ACETONIDE 0.1 % EX CREA: 1.0000 "application " | TOPICAL_CREAM | Freq: Two times a day (BID) | CUTANEOUS | Status: DC

## 2013-10-09 MED ORDER — HYDROXYZINE HCL 25 MG PO TABS: 25.0000 mg | ORAL_TABLET | Freq: Three times a day (TID) | ORAL | Status: DC | PRN

## 2013-10-09 NOTE — Patient Instructions (Signed)
Vim da do Cy S?n ??c (Poison Ivy) Vim da do s?n ??c l vim da (vim da ti?p xc) do ch?m vo ch?t gy d? ?ng trn l c?a cy s?n ??c sau khi ti?p xc v?i cy tr??c ?. Pht ban th??ng xu?t hi?n 46 gi? sau khi ti?p xc. Pht ban th??ng c d?ng u c?c (n?t s?n) ho?c m?n n??c theo d?ng v?t di. Ty thu?c vo m?c ?? nh?y c?m c?a chnh b?n, pht ban c th? ch? gy t?y ?? v ng?a, ho?c n c?ng c th? ti?n tri?n thnh m?n n??c c th? v? ra. Nh?ng t?n th??ng ny ph?i ???c ch?m [place] c?n th?n ?? ng?n ng?a nhi?m trng th? pht do vi khu?n (vi trng), sau ? s? ?? l?i s?o. Gi? m?i vng ?? da tr?n kh ro, s?ch s?, ???c b?ng b v ph? m?t l?p thu?c m? ch?ng vi khu?n, n?u c?n. M?t c?ng c th? b? s?ng hp. S? s?ng hp t?i t? nh?t vo bu?i sng v kh h?n vo cc giai ?o?n sau trong ngy. B?nh vim da ny th??ng s? lnh m khng ?? l?i s?o trong vng 2 ??n 3 tu?n khng c?n ?i?u tr?Marland Kitchen.  H??NG D?N CH?M [place] T?I NH R?a s?ch b?ng x phng v n??c ngay sau khi ti?p xc v?i cy s?n ??c. B?n c kho?ng n?a ti?ng lo?i b? nh?a cy tr??c khi n gy pht ban. Vi?c r?a ny s? ph h?y d?u ho?c khng nguyn trn da gy ra ho?c s? gy ra pht ban. ??m b?o l r?a bn d??i mng tay v b?t k? cht nh?a cy no ? ? s? ti?p t?c lm pht ban lan r?ng. Khng c? da m?nh khi r?a nh?ng vng b? ?nh h??ng. Vim da s?n ??c khng lan ra nh?ng ch? khc n?u khng cn d?u c?a cy trn c? th?. Pht ban ? ti?n tri?n thnh v?t lot ch?y n??c s? khng lm cho pht ban lan ra nh?ng ch? khc tr? khi b?n khng r?a k?. ?i?u quan tr?ng l b?n ph?i gi?t s?ch m?i qu?n o b?n ? m?c v nh?ng qu?n o ny c th? mang nh?ng ch?t gy d? ?ng. Pht ban s? tr? l?i n?u b?n m?c qu?n o ch?a gi?t, k? c? sau vi ngy. Young Berryrnh cy ny trong t??ng lai l bi?n php t?t nh?t. Cy s?n ??c c th? ???c nh?n d?ng b?ng s? l. Thng th??ng, cy s?n ??c c ba l v?i cc nhnh hoa trn m?t c?ng duy nh?t. Diphenhydramine c th? ???c mua m khng c?n k toa v ???c s? d?ng khi c?n ?? tr? ng?a.  Khng li xe khi s? d?ng thu?c ny n?u thu?c lm cho b?n bu?n ng?. Hy tham v?n v?i chuyn gia ch?m [place] s?c kh?e v? thu?c cho tr? em. HY ?I KHM N?U:  V?t lot h? pht tri?n.  V?t t?y ?? lan ra ngoi vng pht ban.  B?n nh?n th?y c m? (gi?ng m?) ch?y ra.  B?n b? ?au gia t?ng.  Cc d?u hi?u khc c?a tnh tr?ng nhi?m trng pht tri?n (ch?ng h?n nh? s?t). Document Released: 04/13/2005 Document Revised: 12/14/2012 [facility] Patient Information 2014 [place], [place].

## 2013-10-09 NOTE — Progress Notes (Signed)
[facility] [address], [place] [id number] [phone]  Date:  10/09/2013   Name:  Lori Munoz   DOB:  02/06/1986   MRN:  [id number]  PCP:  No PCP Per Patient    Chief Complaint: Poison Ivy   History of Present Illness:  Lori Munoz is a 28 y.o. very pleasant female patient who presents with the following:  Contact dermatitis for the past 10 days after working in the yard.  Pruritic.  No improvement with OTC antihistamines. No improvement with over the counter medications or other home remedies. Denies other complaint or health concern today.   There are no active problems to display for this patient.   No past medical history on file.  Past Surgical History  Procedure Laterality Date  . Thyroid surgery  2007    History  Substance Use Topics  . Smoking status: Never Smoker   . Smokeless tobacco: Never Used  . Alcohol Use: No    Family History  Problem Relation Age of Onset  . Hypertension Father   . Cancer Father [phone]    stomach  . Cancer Paternal Grandmother [id number]    lung    No Known Allergies  Medication list has been reviewed and updated.  No current outpatient prescriptions on file prior to visit.   No current facility-administered medications on file prior to visit.    Review of Systems:  As per HPI, otherwise negative.    Physical Examination: Filed Vitals:   10/09/13 0835  BP: 100/70  Pulse: 77  Temp: 98.1 F (36.7 C)  Resp: 16   Filed Vitals:   10/09/13 0835  Height: 4' 11.75" (1.518 m)  Weight: 114 lb (51.71 kg)   Body mass index is 22.44 kg/(m^2). Ideal Body Weight: Weight in (lb) to have BMI = 25: 126.7   GEN: WDWN, NAD, Non-toxic, Alert & Oriented x 3 HEENT: Atraumatic, Normocephalic.  Ears and Nose: No external deformity. EXTR: No clubbing/cyanosis/edema NEURO: Normal gait.  PSYCH: Normally interactive. Conversant. Not depressed or anxious appearing.  Calm demeanor.  Skin:  Rash consistent with contact  dermatitis   Assessment and Plan: Poison ivy TAC Hydroxyzine   Signed,  Phillips OdorJeffery Melek Pownall, MD

## 2013-10-10 ENCOUNTER — Ambulatory Visit: Payer: No Typology Code available for payment source | Admitting: Family Medicine

## 2013-10-19 ENCOUNTER — Ambulatory Visit (INDEPENDENT_AMBULATORY_CARE_PROVIDER_SITE_OTHER): Payer: BC Managed Care – PPO | Admitting: Obstetrics & Gynecology

## 2013-10-19 ENCOUNTER — Encounter: Payer: Self-pay | Admitting: Obstetrics & Gynecology

## 2013-10-19 VITALS — BP 99/68 | HR 86 | Ht 59.0 in | Wt 114.4 lb

## 2013-10-19 DIAGNOSIS — N926 Irregular menstruation, unspecified: Secondary | ICD-10-CM | POA: Diagnosis not present

## 2013-10-19 DIAGNOSIS — Z124 Encounter for screening for malignant neoplasm of cervix: Secondary | ICD-10-CM

## 2013-10-19 DIAGNOSIS — Z01419 Encounter for gynecological examination (general) (routine) without abnormal findings: Secondary | ICD-10-CM

## 2013-10-19 DIAGNOSIS — Z113 Encounter for screening for infections with a predominantly sexual mode of transmission: Secondary | ICD-10-CM

## 2013-10-19 DIAGNOSIS — Z1151 Encounter for screening for human papillomavirus (HPV): Secondary | ICD-10-CM

## 2013-10-19 DIAGNOSIS — J069 Acute upper respiratory infection, unspecified: Secondary | ICD-10-CM

## 2013-10-19 MED ORDER — NORETHINDRONE-ETH ESTRADIOL 1-35 MG-MCG PO TABS: 1.0000 | ORAL_TABLET | Freq: Every day | ORAL | Status: DC

## 2013-10-19 NOTE — Progress Notes (Signed)
Patient is having increased yellow discharge that is burning and irritating.

## 2013-10-19 NOTE — Patient Instructions (Signed)

## 2013-10-19 NOTE — Progress Notes (Signed)
Patient ID: Lori Munoz, female   DOB: [birth year], 28 y.o.   MRN: [id number] Subjective:     Lori Munoz is a 28 y.o. female here for a routine exam.  Current complaints: pt c/o yellow itchy discharge vaginally.  Pt about to go on vacation and she requests birth control pills to try to stop her cycle at that time. Pt has URI sx for 1 week   Gynecologic History Patient's last menstrual period was 09/29/2013. Contraception: IUD Last Pap: 11/2011. Results were: normal Last mammogram: n/a.  Obstetric History OB History  Gravida Para Term Preterm AB SAB TAB Ectopic Multiple Living  2 2        2     # Outcome Date GA Lbr Len/2nd Weight Sex Delivery Anes PTL Lv  2 PAR 2011    F SVD   Y  1 PAR 2008    F SVD   Y       The following portions of the patient's history were reviewed and updated as appropriate: allergies, current medications, past family history, past medical history, past social history, past surgical history and problem list.  Review of Systems A comprehensive review of systems was negative.    Objective:    BP 99/68  Pulse 86  Ht 4\' 11"  (1.499 m)  Wt 114 lb 6.4 oz (51.891 kg)  BMI 23.09 kg/m2  LMP 09/29/2013  General Appearance:    Alert, cooperative, no distress, appears stated age                 Neck:   Supple, symmetrical, trachea midline, no adenopathy;    thyroid:  no enlargement/tenderness/nodules; no carotid   bruit or JVD  Back:     Symmetric, no curvature, ROM normal, no CVA tenderness  Lungs:     Clear to auscultation bilaterally, respirations unlabored  Chest Wall:    No tenderness or deformity   Heart:    Regular rate and rhythm, S1 and S2 normal, no murmur, rub   or gallop  Breast Exam:    No tenderness, masses, or nipple abnormality  Abdomen:     Soft, non-tender, bowel sounds active all four quadrants,    no masses, no organomegaly  Genitalia:    Normal female without lesion, discharge or tenderness     Extremities:   Extremities normal, atraumatic,  no cyanosis or edema  Pulses:   2+ and symmetric all extremities  Skin:   Skin color, texture, turgor normal, no rashes or lesions            Assessment:    Healthy female exam.  Viral URI   Plan:    Follow up in: 1 year.   F/u PAP, cx and wet mount

## 2013-10-20 LAB — CYTOLOGY - PAP

## 2014-02-26 ENCOUNTER — Encounter: Payer: Self-pay | Admitting: Obstetrics & Gynecology

## 2014-05-08 ENCOUNTER — Ambulatory Visit: Payer: BC Managed Care – PPO | Admitting: Family Medicine

## 2016-09-16 ENCOUNTER — Telehealth: Payer: Self-pay

## 2016-09-16 ENCOUNTER — Ambulatory Visit (INDEPENDENT_AMBULATORY_CARE_PROVIDER_SITE_OTHER): Payer: BLUE CROSS/BLUE SHIELD | Admitting: Emergency Medicine

## 2016-09-16 ENCOUNTER — Encounter: Payer: Self-pay | Admitting: Emergency Medicine

## 2016-09-16 VITALS — BP 103/67 | HR 69 | Temp 98.0°F | Resp 18 | Ht 59.0 in | Wt 125.4 lb

## 2016-09-16 DIAGNOSIS — M79641 Pain in right hand: Secondary | ICD-10-CM | POA: Diagnosis not present

## 2016-09-16 DIAGNOSIS — R200 Anesthesia of skin: Secondary | ICD-10-CM | POA: Diagnosis not present

## 2016-09-16 DIAGNOSIS — M79642 Pain in left hand: Secondary | ICD-10-CM | POA: Diagnosis not present

## 2016-09-16 NOTE — Patient Instructions (Addendum)
IF you received an x-ray today, you will receive an [facility] from [facility]. Please contact [facility] at [phone] with questions or concerns regarding your invoice.   IF you received labwork today, you will receive an [facility] from [facility]. Please contact [facility] at [phone] with questions or concerns regarding your invoice.   Our billing staff will not be able to assist you with questions regarding bills from these companies.  You will be contacted with the lab results as soon as they are available. The fastest way to get your results is to activate your My Chart account. Instructions are located on the last page of this paperwork. If you have not heard from us regarding the results in 2 weeks, please contact this office.      Carpal Tunnel Syndrome Carpal tunnel syndrome is a condition that causes pain in your hand and arm. The carpal tunnel is a narrow area that is on the palm side of your wrist. Repeated wrist motion or certain diseases may cause swelling in the tunnel. This swelling can pinch the main nerve in the wrist (median nerve). Follow these instructions at home: If you have a splint:   Wear it as told by your doctor. Remove it only as told by your doctor.  Loosen the splint if your fingers:  Become numb and tingle.  Turn blue and cold.  Keep the splint clean and dry. General instructions   Take over-the-counter and prescription medicines only as told by your doctor.  Rest your wrist from any activity that may be causing your pain. If needed, talk to your employer about changes that can be made in your work, such as getting a wrist pad to use while typing.  If directed, apply ice to the painful area:  Put ice in a plastic bag.  Place a towel between your skin and the bag.  Leave the ice on for 20 minutes, 2-3 times per day.  Keep all follow-up visits as told by your doctor. This is important.  Do any exercises as told by your  doctor, physical therapist, or occupational therapist. Contact a doctor if:  You have new symptoms.  Medicine does not help your pain.  Your symptoms get worse. This information is not intended to replace advice given to you by your health care provider. Make sure you discuss any questions you have with your health care provider. Document Released: 04/02/2011 Document Revised: 09/19/2015 Document Reviewed: 08/29/2014 Elsevier Interactive Patient Education  2017 Elsevier Inc.  H?i ch?ng ?ng c? tay (Carpal Tunnel Syndrome) H?i ch?ng ?ng c? tay l m?t b?nh gy ?au ? bn tay v cnh tay qu v?. ?ng c? tay l m?t vng h?p n?m ? bn pha cnh tay c?a c? tay qu v?. Ho?t ??ng c? tay l?p ?i l?p l?i ho?c m?t s? b?nh nh?t ??nh c th? gy s?ng trong ?ng c? tay. Tnh tr?ng s?ng b ch?t dy th?n kinh chnh ? c? tay (giy th?n kinh gi?a). NGUYN NHN Tnh tr?ng ny c th? do:  C? ??ng c? tay l?p ?i l?p l?i.  T?n th??ng c? tay.  Vim kh?p.  U nang ho?c kh?i u ? ?ng c? tay.  Tch t? ch?t d?ch trong lc mang thai. ?i khi khng r nguyn nhn gy ra tnh tr?ng ny. CC Y?U T? NGUY C? Tnh tr?ng ny hay x?y ra h?n ?:  Nh?ng ng??i c cng vi?c ?i h?i h? ph?i c? ??ng c? tay lin t?c cng m?t ??ng  tc, ch?ng h?n nh? ng??i bn th?t v nhn vin thu ngn.  Ph? n?.  Nh?ng ng??i c m?t s? b?nh nh?t ??nh, ch?ng h?n nh?:  Ti?u ???ng.  Bo ph.  Tuy?n gip ho?t ??ng km (nh??c gip).  Suy th?n. TRI?U CH?NG Nh?ng tri?u ch?ng c?a tnh tr?ng ny bao g?m:  C?m gic t bu?t ? cc ngn tay, ??c bi?t l ngn ci, ngn tr? v ngn gi?a.  T bu?t ho?c t b ? bn tay.  M?t c?m gic ?au ? ton b? cnh tay, ??c bi?t l khi c? tay qu v? b? g?p trong m?t th?i gian di.  ?au c? tay lan ln cnh tay ??n vai.  C?n ?au khng lan xu?ng lng bn tay ho?c ngn tay.  C?m gic y?u ? tay. Qu v? kh c?m ho?c gi? ?? v?t. Tri?u ch?ng c?a qu v? c th? c?m th?y n?ng h?n vo ban ?m. CH?N ?ON Tnh tr?ng ny  c th? ???c ch?n ?on d?a vo khai thc b?nh s? v khm th?c th?Ladell Heads v? c?ng c th? ph?i lm cc ki?m tra, bao g?m:  ?i?n c? ?? (EMG). Ki?m tra ny ?o cc tn hi?u ?i?n t? dy th?n kinh g?i ??n c? c?a qu v?.  Ch?p X quang. ?I?U TR? ?i?u tr? b?nh ny bao g?m:  Thay ??i l?i s?ng. ?i?u quan tr?ng l ph?i d?ng th?c hi?n ho?c ?i?u ch?nh ho?t ??ng gy ra tnh tr?ng b?nh.  Li?u php v?t l ho?c ngh? nghi?p.  Thu?c ?? gi?m ?au v gi?m vim. Thu?c c th? bao g?m thu?c tim vo c? tay.  N?p c? tay.  Ph?u thu?t. H??NG D?N CH?M [place] T?I NH N?u qu v? s? d?ng n?p:  Hy ?eo n?p theo ch? d?n c?a chuyn gia ch?m [place] s?c kh?e. Ch? tho ra theo ch? d?n c?a chuyn gia ch?m [place] s?c kh?e.  N?i l?ng n?p n?u cc ngn tay b? t v ?au bu?t, ho?c n?u ngn tay b? l?nh v c mu xanh.  Gi? cho n?p s?ch s? v kh. H??ng d?n chung  Ch? s? d?ng thu?c khng c?n k ??n v thu?c c?n k ??n theo ch? d?n c?a chuyn gia ch?m [place] s?c kh?e.  Cho c? tay ngh? khng th?c hi?n b?t k? ??ng tc no c th? gy ?au. N?u tnh tr?ng c?a qu v? lin quan ??n cng vi?c, hy ni v?i ch? s? d?ng lao ??ng v? nh?ng thay ??i c th? th?c hi?n, ch?ng h?n l?y m?t mi?ng ??m c? tay ?? s? d?ng khi ?nh my.  N?u ???c ch? d?n, hy ch??m ? l?nh vo vng b? ?au:  Cho ? l?nh vo ti nh?a.  ?? kh?n t?m ? gi?a da v ti ch??m.  Ch??m ? l?nh trong 20 pht, 2-3 l?n m?i ngy.  Tun th? t?t c? cc cu?c h?n khm l?i theo ch? d?n c?a chuyn gia ch?m [place] s?c kh?e. ?i?u ny c vai tr quan tr?ng.  T?p b?t k? bi t?p no theo ch? d?n c?a chuyn gia ch?m [place] s?c kh?e, chuyn gia v?t l tr? li?u ho?c chuyn gia v? b?nh ngh? nghi?p. ?I KHM N?U:  Qu v? c cc tri?u ch?ng m?i.  C?n ?au c?a qu v? khng ki?m sot ???c b?ng thu?c.  Tri?u ch?ng c?a qu v? tr?m tr?ng h?n. Thng tin ny khng nh?m m?c ?ch thay th? cho l?i khuyn m chuyn gia ch?m [place] s?c kh?e ni v?i qu v?. Hy b?o ??m qu v? ph?i th?o lu?n b?t k?  v?n ?? g m qu v? c v?i  chuyn gia ch?m [place] s?c kh?e c?a qu v?. Document Released: 04/13/2005 Document Revised: 08/05/2015 Document Reviewed: 08/29/2014 Elsevier Interactive Patient Education  2017 [employer].

## 2016-09-16 NOTE — Progress Notes (Signed)
Lori Munoz 31 y.o.   Chief Complaint  Patient presents with  . Hand Pain    right hand is numb that travels to wrist and left hand's fingers feel numb  x2 weeks     HISTORY OF PRESENT ILLNESS: This is a 31 y.o. female complaining of bilateral hand numbness, worse at night, R>L, for "years"; denies trauma; hand/nails technician for occupation; denies any other significant symptoms; had normal blood work done last November per patient.  HPI   Prior to Admission medications   Medication Sig Start Date End Date Taking? Authorizing Provider  cetirizine (ZYRTEC) 10 MG tablet Take 10 mg by mouth daily.    [provider]  hydrOXYzine (ATARAX/VISTARIL) 25 MG tablet Take 1 tablet (25 mg total) by mouth 3 (three) times daily as needed for itching. Patient not taking: Reported on 09/16/2016 10/09/13   Carmelina Dane, MD  norethindrone-ethinyl estradiol 1/35 [facility] 1/35, 28,) tablet Take 1 tablet by mouth daily. Patient not taking: Reported on 09/16/2016 10/19/13   Willodean Rosenthal, MD  triamcinolone cream (KENALOG) 0.1 % Apply 1 application topically 2 (two) times daily. Patient not taking: Reported on 09/16/2016 10/09/13   Carmelina Dane, MD    No Known Allergies  There are no active problems to display for this patient.   No past medical history on file.  Past Surgical History:  Procedure Laterality Date  . THYROID SURGERY  2007    Social History   Social History  . Marital status: Married    Spouse name: N/A  . Number of children: N/A  . Years of education: N/A   Occupational History  . Nail technician    Social History Main Topics  . Smoking status: Never Smoker  . Smokeless tobacco: Never Used  . Alcohol use No  . Drug use: No  . Sexual activity: Not Currently    Partners: Male    Birth control/ protection: IUD   Other Topics Concern  . Not on file   Social History Narrative  . No narrative on file    Family History  Problem Relation Age  of Onset  . Hypertension Father   . Cancer Father 64       stomach  . Cancer Paternal Grandmother 24       lung     Review of Systems  Constitutional: Negative for chills and fever.  HENT: Negative.   Eyes: Negative.   Respiratory: Negative.  Negative for cough and shortness of breath.   Cardiovascular: Negative.  Negative for chest pain, palpitations and leg swelling.  Gastrointestinal: Negative.  Negative for abdominal pain, diarrhea, nausea and vomiting.  Genitourinary: Negative.   Musculoskeletal: Negative for back pain, joint pain and myalgias.  Skin: Negative for rash.  Neurological: Positive for tingling (hands) and sensory change (both hands). Negative for dizziness, focal weakness, seizures and headaches.  Endo/Heme/Allergies: Negative.   All other systems reviewed and are negative.  Vitals:   09/16/16 0825  BP: 103/67  Pulse: 69  Resp: 18  Temp: 98 F (36.7 C)     Physical Exam  Constitutional: She is oriented to person, place, and time. She appears well-developed and well-nourished.  HENT:  Head: Normocephalic and atraumatic.  Mouth/Throat: Oropharynx is clear and moist.  Eyes: Conjunctivae and EOM are normal. Pupils are equal, round, and reactive to light.  Neck: Normal range of motion. Neck supple.  Cardiovascular: Normal rate, regular rhythm and normal heart sounds.   Pulmonary/Chest: Effort normal and breath sounds  normal.  Musculoskeletal:  Hands: NVI; good circulation/cap refill; FROM, good grip; no deficits; rest of extremities: WNL/FROM  Neurological: She is alert and oriented to person, place, and time. No sensory deficit. She exhibits normal muscle tone.  Skin: Skin is warm and dry. Capillary refill takes less than 2 seconds. No rash noted.  Psychiatric: She has a normal mood and affect. Her behavior is normal.  Vitals reviewed.    ASSESSMENT & PLAN: Lori Munoz was seen today for hand pain.  Diagnoses and all orders for this visit:  Bilateral  hand numbness Comments: suspected CTS Orders: -     Ambulatory referral to Orthopedic Surgery  Bilateral hand pain -     Ambulatory referral to Orthopedic Surgery    Patient Instructions       IF you received an x-ray today, you will receive an invoice from [facility]. Please contact [facility] at [phone] with questions or concerns regarding your invoice.   IF you received labwork today, you will receive an invoice from [place]. Please contact [facility] at [phone] with questions or concerns regarding your invoice.   Our billing staff will not be able to assist you with questions regarding bills from these companies.  You will be contacted with the lab results as soon as they are available. The fastest way to get your results is to activate your My Chart account. Instructions are located on the last page of this paperwork. If you have not heard from [place] regarding the results in 2 weeks, please contact this office.      Carpal Tunnel Syndrome Carpal tunnel syndrome is a condition that causes pain in your hand and arm. The carpal tunnel is a narrow area that is on the palm side of your wrist. Repeated wrist motion or certain diseases may cause swelling in the tunnel. This swelling can pinch the main nerve in the wrist (median nerve). Follow these instructions at home: If you have a splint:   Wear it as told by your doctor. Remove it only as told by your doctor.  Loosen the splint if your fingers:  Become numb and tingle.  Turn blue and cold.  Keep the splint clean and dry. General instructions   Take over-the-counter and prescription medicines only as told by your doctor.  Rest your wrist from any activity that may be causing your pain. If needed, talk to your employer about changes that can be made in your work, such as getting a wrist pad to use while typing.  If directed, apply ice to the painful area:  Put ice in a plastic bag.  Place  a towel between your skin and the bag.  Leave the ice on for 20 minutes, 2-3 times per day.  Keep all follow-up visits as told by your doctor. This is important.  Do any exercises as told by your doctor, physical therapist, or occupational therapist. Contact a doctor if:  You have new symptoms.  Medicine does not help your pain.  Your symptoms get worse. This information is not intended to replace advice given to you by your health care provider. Make sure you discuss any questions you have with your health care provider. Document Released: 04/02/2011 Document Revised: 09/19/2015 Document Reviewed: 08/29/2014 Elsevier Interactive Patient Education  2017 Elsevier Inc.  H?i ch?ng ?ng c? tay (Carpal Tunnel Syndrome) H?i ch?ng ?ng c? tay l m?t b?nh gy ?au ? bn tay v cnh tay qu v?. ?ng c? tay l m?t vng h?p n?m ? bn  pha cnh tay c?a c? tay qu v?. Ho?t ??ng c? tay l?p ?i l?p l?i ho?c m?t s? b?nh nh?t ??nh c th? gy s?ng trong ?ng c? tay. Tnh tr?ng s?ng b ch?t dy th?n kinh chnh ? c? tay (giy th?n kinh gi?a). NGUYN NHN Tnh tr?ng ny c th? do:  C? ??ng c? tay l?p ?i l?p l?i.  T?n th??ng c? tay.  Vim kh?p.  U nang ho?c kh?i u ? ?ng c? tay.  Tch t? ch?t d?ch trong lc mang thai. ?i khi khng r nguyn nhn gy ra tnh tr?ng ny. CC Y?U T? NGUY C? Tnh tr?ng ny hay x?y ra h?n ?:  Nh?ng ng??i c cng vi?c ?i h?i h? ph?i c? ??ng c? tay lin t?c cng m?t ??ng tc, ch?ng h?n nh? ng??i bn th?t v nhn vin thu ngn.  Ph? n?.  Nh?ng ng??i c m?t s? b?nh nh?t ??nh, ch?ng h?n nh?:  Ti?u ???ng.  Bo ph.  Tuy?n gip ho?t ??ng km (nh??c gip).  Suy th?n. TRI?U CH?NG Nh?ng tri?u ch?ng c?a tnh tr?ng ny bao g?m:  C?m gic t bu?t ? cc ngn tay, ??c bi?t l ngn ci, ngn tr? v ngn gi?a.  T bu?t ho?c t b ? bn tay.  M?t c?m gic ?au ? ton b? cnh tay, ??c bi?t l khi c? tay qu v? b? g?p trong m?t th?i gian di.  ?au c? tay lan ln cnh tay ??n  vai.  C?n ?au khng lan xu?ng lng bn tay ho?c ngn tay.  C?m gic y?u ? tay. Qu v? kh c?m ho?c gi? ?? v?t. Tri?u ch?ng c?a qu v? c th? c?m th?y n?ng h?n vo ban ?m. CH?N ?ON Tnh tr?ng ny c th? ???c ch?n ?on d?a vo khai thc b?nh s? v khm th?c th?Ladell Heads v? c?ng c th? ph?i lm cc ki?m tra, bao g?m:  ?i?n c? ?? (EMG). Ki?m tra ny ?o cc tn hi?u ?i?n t? dy th?n kinh g?i ??n c? c?a qu v?.  Ch?p X quang. ?I?U TR? ?i?u tr? b?nh ny bao g?m:  Thay ??i l?i s?ng. ?i?u quan tr?ng l ph?i d?ng th?c hi?n ho?c ?i?u ch?nh ho?t ??ng gy ra tnh tr?ng b?nh.  Li?u php v?t l ho?c ngh? nghi?p.  Thu?c ?? gi?m ?au v gi?m vim. Thu?c c th? bao g?m thu?c tim vo c? tay.  N?p c? tay.  Ph?u thu?t. H??NG D?N CH?M [place] T?I NH N?u qu v? s? d?ng n?p:  Hy ?eo n?p theo ch? d?n c?a chuyn gia ch?m [place] s?c kh?e. Ch? tho ra theo ch? d?n c?a chuyn gia ch?m [place] s?c kh?e.  N?i l?ng n?p n?u cc ngn tay b? t v ?au bu?t, ho?c n?u ngn tay b? l?nh v c mu xanh.  Gi? cho n?p s?ch s? v kh. H??ng d?n chung  Ch? s? d?ng thu?c khng c?n k ??n v thu?c c?n k ??n theo ch? d?n c?a chuyn gia ch?m [place] s?c kh?e.  Cho c? tay ngh? khng th?c hi?n b?t k? ??ng tc no c th? gy ?au. N?u tnh tr?ng c?a qu v? lin quan ??n cng vi?c, hy ni v?i ch? s? d?ng lao ??ng v? nh?ng thay ??i c th? th?c hi?n, ch?ng h?n l?y m?t mi?ng ??m c? tay ?? s? d?ng khi ?nh my.  N?u ???c ch? d?n, hy ch??m ? l?nh vo vng b? ?au:  Cho ? l?nh vo ti nh?a.  ?? kh?n t?m ? gi?a da v ti ch??m.  Ch??m ? l?nh trong 20 pht, 2-3 l?n m?i ngy.  Tun th? t?t c? cc cu?c h?n khm l?i theo ch? d?n c?a chuyn gia ch?m [place] s?c kh?e. ?i?u ny c vai tr quan tr?ng.  T?p b?t k? bi t?p no theo ch? d?n c?a chuyn gia ch?m [place] s?c kh?e, chuyn gia v?t l tr? li?u ho?c chuyn gia v? b?nh ngh? nghi?p. ?I K[facility] N?U:  Qu v? c cc tri?u ch?ng m?i.  C?n ?au c?a qu v? khng ki?m sot ???c b?ng thu?c.  Tri?u ch?ng  c?a qu v? tr?m tr?ng h?n. Thng tin ny khng nh?m m?c ?ch thay th? cho l?i khuyn m chuyn gia ch?m [place] s?c kh?e ni v?i qu v?. Hy b?o ??m qu v? ph?i th?o lu?n b?t k? v?n ?? g m qu v? c v?i chuyn gia ch?m [place] s?c kh?e c?a qu v?. Document Released: 04/13/2005 Document Revised: 08/05/2015 Document Reviewed: 08/29/2014 Elsevier Interactive Patient Education  2017 [employer]      Edwina BarthMiguel Shabrea Weldin, MD [facility]

## 2016-09-16 NOTE — Telephone Encounter (Signed)
Pt requests ortho referral to  Dr sypher [id number] [place]

## 2016-10-13 ENCOUNTER — Encounter (INDEPENDENT_AMBULATORY_CARE_PROVIDER_SITE_OTHER): Payer: Self-pay | Admitting: Orthopaedic Surgery

## 2016-10-13 ENCOUNTER — Ambulatory Visit (INDEPENDENT_AMBULATORY_CARE_PROVIDER_SITE_OTHER): Payer: BLUE CROSS/BLUE SHIELD | Admitting: Orthopaedic Surgery

## 2016-10-13 DIAGNOSIS — G5602 Carpal tunnel syndrome, left upper limb: Secondary | ICD-10-CM

## 2016-10-13 DIAGNOSIS — G5601 Carpal tunnel syndrome, right upper limb: Secondary | ICD-10-CM | POA: Diagnosis not present

## 2016-10-13 NOTE — Progress Notes (Signed)
Office Visit Note   Patient: Lori Munoz           Date of Birth: June 09, 1985           MRN: [id number] Visit Date: 10/13/2016              Requested by: Georgina QuintSagardia, Miguel Jose, MD [address] [place], [place] [id number] PCP: Patient, No Pcp Per   Assessment & Plan: Visit Diagnoses:  1. Right carpal tunnel syndrome   2. Left carpal tunnel syndrome     Plan: Nerve conduction studies to evaluate for bilateral carpal tunnel syndrome. Splints were also given today. Follow-up after the tests.  Follow-Up Instructions: Return if symptoms worsen or fail to improve.   Orders:  Orders Placed This Encounter  Procedures  . Ambulatory referral to Physical Medicine Rehab   No orders of the defined types were placed in this encounter.     Procedures: No procedures performed   Clinical Data: No additional findings.   Subjective: Chief Complaint  Patient presents with  . Right Hand - Pain    Patient is a very pleasant 31 year old female comes in with bilateral hand pain and numbness worse on the right consistent with carpal tunnel syndrome for many years. She is a Associate Professorcosmetologist. She uses her hands all the time and she feels that her hands go numb and weak with increased activity. She wakes up with pain. The pain is worse at night. The pain and numbness is mainly in her radial 3 digits.    Review of Systems  Constitutional: Negative.   HENT: Negative.   Eyes: Negative.   Respiratory: Negative.   Cardiovascular: Negative.   Endocrine: Negative.   Musculoskeletal: Negative.   Neurological: Negative.   Hematological: Negative.   Psychiatric/Behavioral: Negative.   All other systems reviewed and are negative.    Objective: Vital Signs: There were no vitals taken for this visit.  Physical Exam  Constitutional: She is oriented to person, place, and time. She appears well-developed and well-nourished.  HENT:  Head: Normocephalic and atraumatic.  Eyes: EOM are normal.  Neck:  Neck supple.  Pulmonary/Chest: Effort normal.  Abdominal: Soft.  Neurological: She is alert and oriented to person, place, and time.  Skin: Skin is warm. Capillary refill takes less than 2 seconds.  Psychiatric: She has a normal mood and affect. Her behavior is normal. Judgment and thought content normal.  Nursing note and vitals reviewed.   Ortho Exam Bilateral hand exam shows no muscular atrophy. She has positive carpal tunnel signs. Specialty Comments:  No specialty comments available.  Imaging: No results found.   PMFS History: Patient Active Problem List   Diagnosis Date Noted  . Bilateral hand pain 09/16/2016  . Bilateral hand numbness 09/16/2016   No past medical history on file.  Family History  Problem Relation Age of Onset  . Hypertension Father   . Cancer Father [id number]       stomach  . Cancer Paternal Grandmother [phone]       lung    Past Surgical History:  Procedure Laterality Date  . THYROID SURGERY  2007   Social History   Occupational History  . Nail technician    Social History Main Topics  . Smoking status: Never Smoker  . Smokeless tobacco: Never Used  . Alcohol use No  . Drug use: No  . Sexual activity: Not Currently    Partners: Male    Birth control/ protection: IUD

## 2016-11-10 ENCOUNTER — Ambulatory Visit (INDEPENDENT_AMBULATORY_CARE_PROVIDER_SITE_OTHER): Payer: BLUE CROSS/BLUE SHIELD | Admitting: Physical Medicine and Rehabilitation

## 2016-11-10 ENCOUNTER — Encounter (INDEPENDENT_AMBULATORY_CARE_PROVIDER_SITE_OTHER): Payer: Self-pay | Admitting: Physical Medicine and Rehabilitation

## 2016-11-10 DIAGNOSIS — R202 Paresthesia of skin: Secondary | ICD-10-CM | POA: Diagnosis not present

## 2016-11-10 NOTE — Progress Notes (Deleted)
Numbness in hands- right hand is the worst. Whole hand, but worse in third, fourth, and fifth fingers and wrist. Has tried to wear braces at night, but that seems to make numbness in hands worse. Numbness and pain at night. Sometimes drops things. Right hand dominant.

## 2016-11-11 ENCOUNTER — Encounter (INDEPENDENT_AMBULATORY_CARE_PROVIDER_SITE_OTHER): Payer: Self-pay | Admitting: Physical Medicine and Rehabilitation

## 2016-11-11 NOTE — Progress Notes (Signed)
Lori Munoz - 31 y.o. female MRN [id number]  Date of birth: 03/13/1986  Office Visit Note: Visit Date: 11/10/2016 PCP: Patient, No Pcp Per Referred by: No ref. provider found  Subjective: Chief Complaint  Patient presents with  . Right Hand - Numbness  . Left Hand - Numbness   HPI: Mrs. Lori Munoz is a pleasant 31 year old right-hand dominant female who works as a Advertising account plannernail technician who has been having chronic worsening severe right more than left but bilateral hand numbness tingling and pain. She reports worsening symptoms on the right first 3 digits radially. She has tried braces at night but it seems to make everything worse. She reports significant nocturnal increases in pain and numbness and numbness in particular. She does get some symptoms in the left but not nearly as bad as the right. She thinks he is starting to drop objects on the right due to the numbness. She denies any specific symptoms down the arms or frank radicular symptoms. She denies any specific trauma. She has not had prior electrodiagnostic studies.    ROS Otherwise per HPI.  Assessment & Plan: Visit Diagnoses:  1. Paresthesia of skin     Plan: No additional findings.  Impression: The above electrodiagnostic study is ABNORMAL and reveals evidence of:  1.  A moderate right median nerve entrapment at the wrist (carpal tunnel syndrome) affecting sensory and motor components.   2.  A mild left median nerve entrapment at the wrist (carpal tunnel syndrome) affecting sensory components.   There is no significant electrodiagnostic evidence of any other focal nerve entrapment, brachial plexopathy or generalized peripheral neuropathy. The above electrodiagnostic study is ABNORMAL and reveals evidence of    Recommendations: 1.  Follow-up with referring physician. 2.  Continue current management of symptoms. 3.  Suggest surgical evaluation for the right and may be diagnostic and therapeutic injection on the left.   Meds &  Orders: No orders of the defined types were placed in this encounter.   Orders Placed This Encounter  Procedures  . NCV with EMG (electromyography)    Follow-up: Return for Dr. Roda ShuttersXu as scheduled.   Procedures: No procedures performed  EMG & NCV Findings: Evaluation of the right median motor nerve showed prolonged distal onset latency (5.5 ms) and decreased conduction velocity (Elbow-Wrist, 47 m/s).  The left median (across palm) sensory nerve showed prolonged distal peak latency (Wrist, 4.0 ms).  The right median (across palm) sensory nerve showed prolonged distal peak latency (Wrist, 5.7 ms) and prolonged distal peak latency (Palm, 4.2 ms).  All remaining nerves (as indicated in the following tables) were within normal limits.  Left vs. Right side comparison data for the median motor nerve indicates abnormal L-R latency difference (1.4 ms) and abnormal L-R velocity difference (Elbow-Wrist, 10 m/s).  All remaining left vs. right side differences were within normal limits.    All examined muscles (as indicated in the following table) showed no evidence of electrical instability.    Impression: The above electrodiagnostic study is ABNORMAL and reveals evidence of:  1.  A moderate right median nerve entrapment at the wrist (carpal tunnel syndrome) affecting sensory and motor components.   2.  A mild left median nerve entrapment at the wrist (carpal tunnel syndrome) affecting sensory components.   There is no significant electrodiagnostic evidence of any other focal nerve entrapment, brachial plexopathy or generalized peripheral neuropathy. The above electrodiagnostic study is ABNORMAL and reveals evidence of    Recommendations: 1.  Follow-up with referring physician. 2.  Continue current management of symptoms. 3.  Suggest surgical evaluation for the right and may be diagnostic and therapeutic injection on the left.     Nerve Conduction Studies Anti Sensory Summary Table   Stim Site NR  Peak (ms) Norm Peak (ms) P-T Amp (V) Norm P-T Amp Site1 Site2 Delta-P (ms) Dist (cm) Vel (m/s) Norm Vel (m/s)  Left Median Acr Palm Anti Sensory (2nd Digit)  30C  Wrist    *4.0 <3.6 45.6 >10 Wrist Palm 2.2 0.0    Palm    1.8 <2.0 50.7         Right Median Acr Palm Anti Sensory (2nd Digit)  30C  Wrist    *5.7 <3.6 12.9 >10 Wrist Palm 1.5 0.0    Palm    *4.2 <2.0 15.4         Left Radial Anti Sensory (Base 1st Digit)  30C  Wrist    2.1 <3.1 32.3  Wrist Base 1st Digit 2.1 0.0    Right Radial Anti Sensory (Base 1st Digit)  30.3C  Wrist    2.0 <3.1 28.4  Wrist Base 1st Digit 2.0 0.0    Left Ulnar Anti Sensory (5th Digit)  30.1C  Wrist    3.3 <3.7 55.3 >15.0 Wrist 5th Digit 3.3 14.0 42 >38  Right Ulnar Anti Sensory (5th Digit)  30.4C  Wrist    3.0 <3.7 53.2 >15.0 Wrist 5th Digit 3.0 14.0 47 >38   Motor Summary Table   Stim Site NR Onset (ms) Norm Onset (ms) O-P Amp (mV) Norm O-P Amp Site1 Site2 Delta-0 (ms) Dist (cm) Vel (m/s) Norm Vel (m/s)  Left Median Motor (Abd Poll Brev)  30.1C  Wrist    4.1 <4.2 7.9 >5 Elbow Wrist 3.5 20.0 57 >50  Elbow    7.6  7.3         Right Median Motor (Abd Poll Brev)  30.4C  Wrist    *5.5 <4.2 7.3 >5 Elbow Wrist 4.3 20.0 *47 >50  Elbow    9.8  7.2         Left Ulnar Motor (Abd Dig Min)  30.1C  Wrist    2.7 <4.2 14.0 >3 B Elbow Wrist 3.1 18.0 58 >53  B Elbow    5.8  14.1  A Elbow B Elbow 1.2 9.0 75 >53  A Elbow    7.0  13.5         Right Ulnar Motor (Abd Dig Min)  30.5C  Wrist    2.7 <4.2 12.5 >3 B Elbow Wrist 2.9 19.0 66 >53  B Elbow    5.6  13.0  A Elbow B Elbow 1.2 10.0 83 >53  A Elbow    6.8  12.5          EMG   Side Muscle Nerve Root Ins Act Fibs Psw Amp Dur Poly Recrt Int Dennie Bible Comment  Right Abd Poll Brev Median C8-T1 Nml Nml Nml Nml Nml 0 Nml Nml   Right 1stDorInt Ulnar C8-T1 Nml Nml Nml Nml Nml 0 Nml Nml     Nerve Conduction Studies Anti Sensory Left/Right Comparison   Stim Site L Lat (ms) R Lat (ms) L-R Lat (ms) L Amp (V) R Amp  (V) L-R Amp (%) Site1 Site2 L Vel (m/s) R Vel (m/s) L-R Vel (m/s)  Median Acr Palm Anti Sensory (2nd Digit)  30C  Wrist *4.0 *5.7 1.7 45.6 12.9 71.7 Wrist Palm     Palm 1.8 *4.2 2.4 50.7 15.4  69.6       Radial Anti Sensory (Base 1st Digit)  30C  Wrist 2.1 2.0 0.1 32.3 28.4 12.1 Wrist Base 1st Digit     Ulnar Anti Sensory (5th Digit)  30.1C  Wrist 3.3 3.0 0.3 55.3 53.2 3.8 Wrist 5th Digit 42 47 5   Motor Left/Right Comparison   Stim Site L Lat (ms) R Lat (ms) L-R Lat (ms) L Amp (mV) R Amp (mV) L-R Amp (%) Site1 Site2 L Vel (m/s) R Vel (m/s) L-R Vel (m/s)  Median Motor (Abd Poll Brev)  30.1C  Wrist 4.1 *5.5 *1.4 7.9 7.3 7.6 Elbow Wrist 57 *47 *10  Elbow 7.6 9.8 2.2 7.3 7.2 1.4       Ulnar Motor (Abd Dig Min)  30.1C  Wrist 2.7 2.7 0.0 14.0 12.5 10.7 B Elbow Wrist 58 66 8  B Elbow 5.8 5.6 0.2 14.1 13.0 7.8 A Elbow B Elbow 75 83 8  A Elbow 7.0 6.8 0.2 13.5 12.5 7.4             Clinical History: No specialty comments available.  She reports that she has never smoked. She has never used smokeless tobacco. No results for input(s): HGBA1C, LABURIC in the last 8760 hours.  Objective:  VS:  HT:    WT:   BMI:     BP:   HR: bpm  TEMP: ( )  RESP:  Physical Exam  Musculoskeletal:  Inspection reveals no atrophy of the bilateral APB or FDI or hand intrinsics. There is no swelling, color changes, allodynia or dystrophic changes. There is 5 out of 5 strength in the bilateral wrist extension, finger abduction and long finger flexion. There is some impaired sensation in the median nerve distribution on the right compared to the left.     Ortho Exam Imaging: No results found.  Past Medical/Family/Surgical/Social History: Medications & Allergies reviewed per EMR Patient Active Problem List   Diagnosis Date Noted  . Bilateral hand pain 09/16/2016  . Bilateral hand numbness 09/16/2016   History reviewed. No pertinent past medical history. Family History  Problem Relation Age of Onset   . Hypertension Father   . Cancer Father 76       stomach  . Cancer Paternal Grandmother 43       lung   Past Surgical History:  Procedure Laterality Date  . THYROID SURGERY  2007   Social History   Occupational History  . Nail technician    Social History Main Topics  . Smoking status: Never Smoker  . Smokeless tobacco: Never Used  . Alcohol use No  . Drug use: No  . Sexual activity: Not Currently    Partners: Male    Birth control/ protection: IUD

## 2016-11-11 NOTE — Procedures (Signed)
EMG & NCV Findings: Evaluation of the right median motor nerve showed prolonged distal onset latency (5.5 ms) and decreased conduction velocity (Elbow-Wrist, 47 m/s).  The left median (across palm) sensory nerve showed prolonged distal peak latency (Wrist, 4.0 ms).  The right median (across palm) sensory nerve showed prolonged distal peak latency (Wrist, 5.7 ms) and prolonged distal peak latency (Palm, 4.2 ms).  All remaining nerves (as indicated in the following tables) were within normal limits.  Left vs. Right side comparison data for the median motor nerve indicates abnormal L-R latency difference (1.4 ms) and abnormal L-R velocity difference (Elbow-Wrist, 10 m/s).  All remaining left vs. right side differences were within normal limits.    All examined muscles (as indicated in the following table) showed no evidence of electrical instability.    Impression: The above electrodiagnostic study is ABNORMAL and reveals evidence of:  1.  A moderate right median nerve entrapment at the wrist (carpal tunnel syndrome) affecting sensory and motor components.   2.  A mild left median nerve entrapment at the wrist (carpal tunnel syndrome) affecting sensory components.   There is no significant electrodiagnostic evidence of any other focal nerve entrapment, brachial plexopathy or generalized peripheral neuropathy. The above electrodiagnostic study is ABNORMAL and reveals evidence of    Recommendations: 1.  Follow-up with referring physician. 2.  Continue current management of symptoms. 3.  Suggest surgical evaluation for the right and may be diagnostic and therapeutic injection on the left.     Nerve Conduction Studies Anti Sensory Summary Table   Stim Site NR Peak (ms) Norm Peak (ms) P-T Amp (V) Norm P-T Amp Site1 Site2 Delta-P (ms) Dist (cm) Vel (m/s) Norm Vel (m/s)  Left Median Acr Palm Anti Sensory (2nd Digit)  30C  Wrist    *4.0 <3.6 45.6 >10 Wrist Palm 2.2 0.0    Palm    1.8 <2.0 50.7          Right Median Acr Palm Anti Sensory (2nd Digit)  30C  Wrist    *5.7 <3.6 12.9 >10 Wrist Palm 1.5 0.0    Palm    *4.2 <2.0 15.4         Left Radial Anti Sensory (Base 1st Digit)  30C  Wrist    2.1 <3.1 32.3  Wrist Base 1st Digit 2.1 0.0    Right Radial Anti Sensory (Base 1st Digit)  30.3C  Wrist    2.0 <3.1 28.4  Wrist Base 1st Digit 2.0 0.0    Left Ulnar Anti Sensory (5th Digit)  30.1C  Wrist    3.3 <3.7 55.3 >15.0 Wrist 5th Digit 3.3 14.0 42 >38  Right Ulnar Anti Sensory (5th Digit)  30.4C  Wrist    3.0 <3.7 53.2 >15.0 Wrist 5th Digit 3.0 14.0 47 >38   Motor Summary Table   Stim Site NR Onset (ms) Norm Onset (ms) O-P Amp (mV) Norm O-P Amp Site1 Site2 Delta-0 (ms) Dist (cm) Vel (m/s) Norm Vel (m/s)  Left Median Motor (Abd Poll Brev)  30.1C  Wrist    4.1 <4.2 7.9 >5 Elbow Wrist 3.5 20.0 57 >50  Elbow    7.6  7.3         Right Median Motor (Abd Poll Brev)  30.4C  Wrist    *5.5 <4.2 7.3 >5 Elbow Wrist 4.3 20.0 *47 >50  Elbow    9.8  7.2         Left Ulnar Motor (Abd Dig Min)  30.1C  Wrist  2.7 <4.2 14.0 >3 B Elbow Wrist 3.1 18.0 58 >53  B Elbow    5.8  14.1  A Elbow B Elbow 1.2 9.0 75 >53  A Elbow    7.0  13.5         Right Ulnar Motor (Abd Dig Min)  30.5C  Wrist    2.7 <4.2 12.5 >3 B Elbow Wrist 2.9 19.0 66 >53  B Elbow    5.6  13.0  A Elbow B Elbow 1.2 10.0 83 >53  A Elbow    6.8  12.5          EMG   Side Muscle Nerve Root Ins Act Fibs Psw Amp Dur Poly Recrt Int Dennie BiblePat Comment  Right Abd Poll Brev Median C8-T1 Nml Nml Nml Nml Nml 0 Nml Nml   Right 1stDorInt Ulnar C8-T1 Nml Nml Nml Nml Nml 0 Nml Nml     Nerve Conduction Studies Anti Sensory Left/Right Comparison   Stim Site L Lat (ms) R Lat (ms) L-R Lat (ms) L Amp (V) R Amp (V) L-R Amp (%) Site1 Site2 L Vel (m/s) R Vel (m/s) L-R Vel (m/s)  Median Acr Palm Anti Sensory (2nd Digit)  30C  Wrist *4.0 *5.7 1.7 45.6 12.9 71.7 Wrist Palm     Palm 1.8 *4.2 2.4 50.7 15.4 69.6       Radial Anti Sensory (Base 1st Digit)   30C  Wrist 2.1 2.0 0.1 32.3 28.4 12.1 Wrist Base 1st Digit     Ulnar Anti Sensory (5th Digit)  30.1C  Wrist 3.3 3.0 0.3 55.3 53.2 3.8 Wrist 5th Digit 42 47 5   Motor Left/Right Comparison   Stim Site L Lat (ms) R Lat (ms) L-R Lat (ms) L Amp (mV) R Amp (mV) L-R Amp (%) Site1 Site2 L Vel (m/s) R Vel (m/s) L-R Vel (m/s)  Median Motor (Abd Poll Brev)  30.1C  Wrist 4.1 *5.5 *1.4 7.9 7.3 7.6 Elbow Wrist 57 *47 *10  Elbow 7.6 9.8 2.2 7.3 7.2 1.4       Ulnar Motor (Abd Dig Min)  30.1C  Wrist 2.7 2.7 0.0 14.0 12.5 10.7 B Elbow Wrist 58 66 8  B Elbow 5.8 5.6 0.2 14.1 13.0 7.8 A Elbow B Elbow 75 83 8  A Elbow 7.0 6.8 0.2 13.5 12.5 7.4

## 2016-11-24 ENCOUNTER — Encounter (INDEPENDENT_AMBULATORY_CARE_PROVIDER_SITE_OTHER): Payer: Self-pay | Admitting: Orthopaedic Surgery

## 2016-11-24 ENCOUNTER — Ambulatory Visit (INDEPENDENT_AMBULATORY_CARE_PROVIDER_SITE_OTHER): Payer: BLUE CROSS/BLUE SHIELD | Admitting: Orthopaedic Surgery

## 2016-11-24 DIAGNOSIS — G5602 Carpal tunnel syndrome, left upper limb: Secondary | ICD-10-CM | POA: Diagnosis not present

## 2016-11-24 DIAGNOSIS — G5601 Carpal tunnel syndrome, right upper limb: Secondary | ICD-10-CM | POA: Diagnosis not present

## 2016-11-24 NOTE — Progress Notes (Signed)
   Office Visit Note   Patient: Lori Munoz           Date of Birth: 1985-07-27           MRN: [id number] Visit Date: 11/24/2016              Requested by: No referring provider defined for this encounter. PCP: Patient, No Pcp Per   Assessment & Plan: Visit Diagnoses:  1. Right carpal tunnel syndrome   2. Left carpal tunnel syndrome     Plan: Patient has moderate carpal tunnel syndrome of the right and mild on the left. At this point she is not ready for surgery. She declined an injection on the right. Continue wearing braces on the left. The brace makes her symptoms worsen the right. She'll let us know when she is ready for surgery.  Follow-Up Instructions: Return if symptoms worsen or fail to improve.   Orders:  No orders of the defined types were placed in this encounter.  No orders of the defined types were placed in this encounter.     Procedures: No procedures performed   Clinical Data: No additional findings.   Subjective: Chief Complaint  Patient presents with  . Right Hand - Pain  . Left Hand - Pain    Patient follows up today to review her nerve conduction studies. Denies any new symptoms.    Review of Systems   Objective: Vital Signs: There were no vitals taken for this visit.  Physical Exam  Ortho Exam Exam is stable. Specialty Comments:  No specialty comments available.  Imaging: No results found.   PMFS History: Patient Active Problem List   Diagnosis Date Noted  . Bilateral hand pain 09/16/2016  . Bilateral hand numbness 09/16/2016   No past medical history on file.  Family History  Problem Relation Age of Onset  . Hypertension Father   . Cancer Father [id number]       stomach  . Cancer Paternal Grandmother 275       lung    Past Surgical History:  Procedure Laterality Date  . THYROID SURGERY  2007   Social History   Occupational History  . Nail technician    Social History Main Topics  . Smoking status: Never Smoker  .  Smokeless tobacco: Never Used  . Alcohol use No  . Drug use: No  . Sexual activity: Not Currently    Partners: Male    Birth control/ protection: IUD

## 2017-05-28 DIAGNOSIS — G5601 Carpal tunnel syndrome, right upper limb: Secondary | ICD-10-CM

## 2017-05-28 HISTORY — DX: Carpal tunnel syndrome, right upper limb: G56.01

## 2017-06-17 ENCOUNTER — Other Ambulatory Visit: Payer: Self-pay

## 2017-06-17 ENCOUNTER — Encounter (HOSPITAL_BASED_OUTPATIENT_CLINIC_OR_DEPARTMENT_OTHER): Payer: Self-pay | Admitting: *Deleted

## 2017-06-23 ENCOUNTER — Ambulatory Visit (HOSPITAL_BASED_OUTPATIENT_CLINIC_OR_DEPARTMENT_OTHER)
Admission: RE | Admit: 2017-06-23 | Payer: BLUE CROSS/BLUE SHIELD | Source: Ambulatory Visit | Admitting: Orthopaedic Surgery

## 2017-06-23 ENCOUNTER — Telehealth (INDEPENDENT_AMBULATORY_CARE_PROVIDER_SITE_OTHER): Payer: Self-pay | Admitting: Orthopaedic Surgery

## 2017-06-23 HISTORY — DX: Carpal tunnel syndrome, right upper limb: G56.01

## 2017-06-23 SURGERY — CARPAL TUNNEL RELEASE
Anesthesia: Choice | Laterality: Right

## 2017-06-23 NOTE — Telephone Encounter (Signed)
Called pt no answer will try again tomorrow VM box is not set up yet

## 2017-06-23 NOTE — Telephone Encounter (Signed)
Pt came into the office this morning and would like to proceed with nerve testing since her insurance was not in network to complete surgery. Pt is thinking about going to a new orthopedic office for surgery purposes. Pt surgery was sched for 06/23/17 please call pt to discuss pt care. Pt is very pleased with our office pt is just looking for some guidance with her pt care.   Surgery Date 06/23/17 canceled due to insurance not in network

## 2017-06-23 NOTE — Telephone Encounter (Signed)
Can you let patient know please.  

## 2017-06-23 NOTE — Telephone Encounter (Signed)
According to my notes she already has had nerve conduction studies.  Does she want us to refer her to a specific surgeon or office?

## 2017-06-23 NOTE — Telephone Encounter (Signed)
See message below °

## 2017-06-29 NOTE — Telephone Encounter (Signed)
Pt came into the office to drop off Medical release form. Please call pt to discuss her next step pertaining to orthopedists. [facility] did provide [facility]. I tried to help pt as much as I could. Ill turn in Medical Release form to [facility] as well as card provided.   [facility]  [phone]  Fax  [phone]

## 2017-06-30 NOTE — Telephone Encounter (Signed)
Lori Munoz [facility]ainer is able to see our records on epic.  She can go wherever she wants.  I don't need to send specific referral

## 2017-06-30 NOTE — Telephone Encounter (Signed)
Patient did complete release to receive copy of records. They were mailed to patient this morning.

## 2017-06-30 NOTE — Telephone Encounter (Signed)
Called Patient to advise on what Dr Roda ShuttersXu said below and states [facility]

## 2017-06-30 NOTE — Telephone Encounter (Signed)
See message below. Are you going to refer her out else where? Or where ever patient wants to go?

## 2019-06-19 ENCOUNTER — Ambulatory Visit: Payer: BLUE CROSS/BLUE SHIELD
